# Patient Record
Sex: Female | Born: 1950 | Race: White | Hispanic: No | Marital: Married | Smoking: Former smoker
Health system: Southern US, Community
[De-identification: ages and names within clinical notes are randomized; demographics above are authoritative.]

## PROBLEM LIST (undated history)

## (undated) DIAGNOSIS — E7800 Pure hypercholesterolemia, unspecified: Secondary | ICD-10-CM

## (undated) DIAGNOSIS — M719 Bursopathy, unspecified: Secondary | ICD-10-CM

## (undated) DIAGNOSIS — M2550 Pain in unspecified joint: Secondary | ICD-10-CM

## (undated) DIAGNOSIS — M797 Fibromyalgia: Secondary | ICD-10-CM

## (undated) DIAGNOSIS — T8859XA Other complications of anesthesia, initial encounter: Secondary | ICD-10-CM

## (undated) DIAGNOSIS — K760 Fatty (change of) liver, not elsewhere classified: Secondary | ICD-10-CM

## (undated) DIAGNOSIS — J189 Pneumonia, unspecified organism: Secondary | ICD-10-CM

## (undated) DIAGNOSIS — R0602 Shortness of breath: Secondary | ICD-10-CM

## (undated) DIAGNOSIS — K5900 Constipation, unspecified: Secondary | ICD-10-CM

## (undated) DIAGNOSIS — F419 Anxiety disorder, unspecified: Secondary | ICD-10-CM

## (undated) DIAGNOSIS — R079 Chest pain, unspecified: Secondary | ICD-10-CM

## (undated) DIAGNOSIS — M1990 Unspecified osteoarthritis, unspecified site: Secondary | ICD-10-CM

## (undated) DIAGNOSIS — F32A Depression, unspecified: Secondary | ICD-10-CM

## (undated) DIAGNOSIS — M47814 Spondylosis without myelopathy or radiculopathy, thoracic region: Secondary | ICD-10-CM

## (undated) DIAGNOSIS — Z9289 Personal history of other medical treatment: Secondary | ICD-10-CM

## (undated) DIAGNOSIS — N289 Disorder of kidney and ureter, unspecified: Secondary | ICD-10-CM

## (undated) DIAGNOSIS — E559 Vitamin D deficiency, unspecified: Secondary | ICD-10-CM

## (undated) DIAGNOSIS — M549 Dorsalgia, unspecified: Secondary | ICD-10-CM

## (undated) DIAGNOSIS — K829 Disease of gallbladder, unspecified: Secondary | ICD-10-CM

## (undated) DIAGNOSIS — E039 Hypothyroidism, unspecified: Secondary | ICD-10-CM

## (undated) DIAGNOSIS — F909 Attention-deficit hyperactivity disorder, unspecified type: Secondary | ICD-10-CM

## (undated) DIAGNOSIS — F988 Other specified behavioral and emotional disorders with onset usually occurring in childhood and adolescence: Secondary | ICD-10-CM

## (undated) DIAGNOSIS — Z91018 Allergy to other foods: Secondary | ICD-10-CM

## (undated) DIAGNOSIS — E079 Disorder of thyroid, unspecified: Secondary | ICD-10-CM

## (undated) DIAGNOSIS — D649 Anemia, unspecified: Secondary | ICD-10-CM

## (undated) DIAGNOSIS — M810 Age-related osteoporosis without current pathological fracture: Secondary | ICD-10-CM

## (undated) DIAGNOSIS — R5383 Other fatigue: Secondary | ICD-10-CM

## (undated) DIAGNOSIS — J45909 Unspecified asthma, uncomplicated: Secondary | ICD-10-CM

## (undated) HISTORY — PX: KNEE SURGERY: SHX244

## (undated) HISTORY — DX: Allergy to other foods: Z91.018

## (undated) HISTORY — DX: Dorsalgia, unspecified: M54.9

## (undated) HISTORY — DX: Disorder of kidney and ureter, unspecified: N28.9

## (undated) HISTORY — DX: Vitamin D deficiency, unspecified: E55.9

## (undated) HISTORY — DX: Unspecified osteoarthritis, unspecified site: M19.90

## (undated) HISTORY — PX: EYE SURGERY: SHX253

## (undated) HISTORY — DX: Depression, unspecified: F32.A

## (undated) HISTORY — DX: Pain in unspecified joint: M25.50

## (undated) HISTORY — DX: Fatty (change of) liver, not elsewhere classified: K76.0

## (undated) HISTORY — DX: Chest pain, unspecified: R07.9

## (undated) HISTORY — DX: Other specified behavioral and emotional disorders with onset usually occurring in childhood and adolescence: F98.8

## (undated) HISTORY — DX: Anemia, unspecified: D64.9

## (undated) HISTORY — DX: Unspecified asthma, uncomplicated: J45.909

## (undated) HISTORY — DX: Attention-deficit hyperactivity disorder, unspecified type: F90.9

## (undated) HISTORY — DX: Fibromyalgia: M79.7

## (undated) HISTORY — DX: Bursopathy, unspecified: M71.9

## (undated) HISTORY — DX: Other fatigue: R53.83

## (undated) HISTORY — DX: Disease of gallbladder, unspecified: K82.9

## (undated) HISTORY — DX: Constipation, unspecified: K59.00

## (undated) HISTORY — PX: COLONOSCOPY: SHX174

## (undated) HISTORY — DX: Shortness of breath: R06.02

## (undated) HISTORY — DX: Hypothyroidism, unspecified: E03.9

## (undated) HISTORY — DX: Spondylosis without myelopathy or radiculopathy, thoracic region: M47.814

## (undated) HISTORY — DX: Anxiety disorder, unspecified: F41.9

## (undated) HISTORY — DX: Age-related osteoporosis without current pathological fracture: M81.0

---

## 1998-08-01 ENCOUNTER — Emergency Department (HOSPITAL_COMMUNITY): Admission: EM | Admit: 1998-08-01 | Discharge: 1998-08-01 | Payer: Self-pay | Admitting: Family Medicine

## 1998-08-14 ENCOUNTER — Emergency Department (HOSPITAL_COMMUNITY): Admission: EM | Admit: 1998-08-14 | Discharge: 1998-08-14 | Payer: Self-pay | Admitting: Emergency Medicine

## 2000-07-15 ENCOUNTER — Other Ambulatory Visit: Admission: RE | Admit: 2000-07-15 | Discharge: 2000-07-15 | Payer: Self-pay | Admitting: *Deleted

## 2000-07-15 ENCOUNTER — Ambulatory Visit (HOSPITAL_COMMUNITY): Admission: RE | Admit: 2000-07-15 | Discharge: 2000-07-15 | Payer: Self-pay | Admitting: *Deleted

## 2000-07-15 ENCOUNTER — Encounter: Payer: Self-pay | Admitting: *Deleted

## 2001-07-22 ENCOUNTER — Ambulatory Visit (HOSPITAL_COMMUNITY): Admission: RE | Admit: 2001-07-22 | Discharge: 2001-07-22 | Payer: Self-pay | Admitting: *Deleted

## 2001-07-22 ENCOUNTER — Encounter: Payer: Self-pay | Admitting: *Deleted

## 2001-09-10 ENCOUNTER — Encounter: Payer: Self-pay | Admitting: Emergency Medicine

## 2001-09-10 ENCOUNTER — Emergency Department (HOSPITAL_COMMUNITY): Admission: EM | Admit: 2001-09-10 | Discharge: 2001-09-10 | Payer: Self-pay | Admitting: Emergency Medicine

## 2004-03-01 ENCOUNTER — Ambulatory Visit (HOSPITAL_COMMUNITY): Admission: RE | Admit: 2004-03-01 | Discharge: 2004-03-01 | Payer: Self-pay | Admitting: Internal Medicine

## 2006-04-01 ENCOUNTER — Ambulatory Visit (HOSPITAL_COMMUNITY): Admission: RE | Admit: 2006-04-01 | Discharge: 2006-04-01 | Payer: Self-pay | Admitting: Family Medicine

## 2006-05-07 ENCOUNTER — Other Ambulatory Visit: Admission: RE | Admit: 2006-05-07 | Discharge: 2006-05-07 | Payer: Self-pay | Admitting: Obstetrics and Gynecology

## 2006-07-16 ENCOUNTER — Ambulatory Visit: Payer: Self-pay | Admitting: Internal Medicine

## 2006-07-30 ENCOUNTER — Ambulatory Visit: Payer: Self-pay | Admitting: Internal Medicine

## 2006-07-30 ENCOUNTER — Encounter (INDEPENDENT_AMBULATORY_CARE_PROVIDER_SITE_OTHER): Payer: Self-pay | Admitting: Specialist

## 2006-12-08 ENCOUNTER — Emergency Department (HOSPITAL_COMMUNITY): Admission: EM | Admit: 2006-12-08 | Discharge: 2006-12-08 | Payer: Self-pay | Admitting: Emergency Medicine

## 2009-04-23 ENCOUNTER — Ambulatory Visit (HOSPITAL_COMMUNITY): Admission: RE | Admit: 2009-04-23 | Discharge: 2009-04-23 | Payer: Self-pay | Admitting: Internal Medicine

## 2010-04-26 ENCOUNTER — Other Ambulatory Visit: Admission: RE | Admit: 2010-04-26 | Discharge: 2010-04-26 | Payer: Self-pay | Admitting: Obstetrics and Gynecology

## 2011-09-15 ENCOUNTER — Other Ambulatory Visit (HOSPITAL_COMMUNITY): Payer: Self-pay | Admitting: Internal Medicine

## 2011-09-15 DIAGNOSIS — Z1231 Encounter for screening mammogram for malignant neoplasm of breast: Secondary | ICD-10-CM

## 2011-10-10 ENCOUNTER — Ambulatory Visit (HOSPITAL_COMMUNITY)
Admission: RE | Admit: 2011-10-10 | Discharge: 2011-10-10 | Disposition: A | Discharge status: Home / Self Care | Payer: Managed Care, Other (non HMO) | Source: Ambulatory Visit | Attending: Internal Medicine | Admitting: Internal Medicine

## 2011-10-10 DIAGNOSIS — Z1231 Encounter for screening mammogram for malignant neoplasm of breast: Secondary | ICD-10-CM | POA: Insufficient documentation

## 2013-05-04 ENCOUNTER — Encounter: Payer: Self-pay | Admitting: Internal Medicine

## 2013-10-14 ENCOUNTER — Ambulatory Visit: Payer: Self-pay | Admitting: Podiatrist

## 2013-10-21 ENCOUNTER — Ambulatory Visit (INDEPENDENT_AMBULATORY_CARE_PROVIDER_SITE_OTHER): Payer: Commercial Managed Care - HMO | Admitting: Podiatrist

## 2013-10-21 ENCOUNTER — Encounter: Payer: Self-pay | Admitting: Podiatrist

## 2013-10-21 ENCOUNTER — Ambulatory Visit (INDEPENDENT_AMBULATORY_CARE_PROVIDER_SITE_OTHER): Payer: Commercial Managed Care - HMO

## 2013-10-21 DIAGNOSIS — R52 Pain, unspecified: Secondary | ICD-10-CM

## 2013-10-21 DIAGNOSIS — M7660 Achilles tendinitis, unspecified leg: Secondary | ICD-10-CM

## 2013-10-21 DIAGNOSIS — M722 Plantar fascial fibromatosis: Secondary | ICD-10-CM

## 2013-10-21 MED ORDER — DICLOFENAC SODIUM 1 % TD GEL: 2.0000 g | Freq: Four times a day (QID) | TRANSDERMAL | Status: DC

## 2013-10-21 MED ORDER — TRIAMCINOLONE ACETONIDE 40 MG/ML IJ SUSP
20.0000 mg | Freq: Once | INTRAMUSCULAR | Status: AC
  Administered 2013-10-21: 20 mg

## 2013-10-21 NOTE — Patient Instructions (Signed)
Achilles Tendinitis Achilles tendinitis is inflammation of the tough, cord-like band that attaches the lower muscles of your leg to your heel (Achilles tendon). It is usually caused by overusing the tendon and joint involved.  CAUSES Achilles tendinitis can happen because of:  A sudden increase in exercise or activity (such as running).  Doing the same exercises or activities (such as jumping) over and over.  Not warming up calf muscles before exercising.  Exercising in shoes that are worn out or not made for exercise.  Having arthritis or a bone growth on the back of the heel bone. This can rub against the tendon and hurt the tendon. SIGNS AND SYMPTOMS The most common symptoms are:  Pain in the back of the leg, just above the heel. The pain usually gets worse with exercise and better with rest.  Stiffness or soreness in the back of the leg, especially in the morning.  Swelling of the skin over the Achilles tendon.  Trouble standing on tiptoe. Sometimes, an Achilles tendon tears (ruptures). Symptoms of an Achilles tendon rupture can include:  Sudden, severe pain in the back of the leg.  Trouble putting weight on the foot or walking normally. DIAGNOSIS Achilles tendinitis will be diagnosed based on symptoms and a physical examination. An X-ray may be done to check if another condition is causing your symptoms.  TREATMENT  Achilles tendinitis usually gets better over time. It can take weeks to months to heal completely. Treatment focuses on treating the symptoms and helping the injury heal. HOME CARE INSTRUCTIONS   Rest your Achilles tendon and avoid activities that cause pain.  Apply ice to the injured area:  Put ice in a plastic bag.  Place a towel between your skin and the bag.  Leave the ice on for 20 minutes, 2 3 times a day  Try to avoid using the tendon (other than gentle range of motion) while the tendon is painful. Do not resume use until instructed by your health  care provider. Then begin use gradually. Do not increase use to the point of pain. If pain does develop, decrease use and continue the above measures. Gradually increase activities that do not cause discomfort until you achieve normal use.  Do exercises to make your calf muscles stronger and more flexible. Your health care provider or physical therapist can recommend exercises for you to do.  Wrap your ankle with an elastic bandage or other wrap. This can help keep your tendon from moving too much. Your health care provider will show you how to wrap your ankle correctly.  Only take over-the-counter or prescription medicines for pain, discomfort, or fever as directed by your health care provider. SEEK MEDICAL CARE IF:   Your pain and swelling increase or pain is uncontrolled with medicines.  You develop new, unexplained symptoms or your symptoms get worse.  You are unable to move your toes or foot.  You develop warmth and swelling in your foot.  You have an unexplained temperature. MAKE SURE YOU:   Understand these instructions.  Will watch your condition.  Will get help right away if you are not doing well or get worse. Document Released: 05/28/2005 Document Revised: 06/08/2013 Document Reviewed: 03/30/2013 [facility] Patient Information 2014 [place].

## 2013-10-21 NOTE — Progress Notes (Signed)
   Subjective:    Patient ID: Amy Blackwell, female    DOB: 01/24/1951, 63 y.o.   MRN: [id number]  HPI Patient presents today for pain on the posterior aspect of the right foot. She states it's been hurting for over a year and she tried meloxicam but it did not help the posterior heel pain on the right foot. She also states she's been having pain in the left foot and mid arch region dorsally. She states that the meloxicam has been helping this region. She relates that her husband squeezed along her right heel she felt pain at that time..   Review of Systems  Constitutional: Positive for fatigue and unexpected weight change.  HENT: Positive for sore throat.   Respiratory: Positive for wheezing.   Musculoskeletal: Positive for back pain, gait problem, joint swelling and myalgias.  Allergic/Immunologic: Positive for food allergies.       Objective:   Physical Exam Vascular exam reveals palpable pedal pulses at 2/4 DP and PT bilateral. Neurological sensation is intact epicriticaly and protectively. Negative Tinel sign is elicited. Dermatological examination is within normal limits. Musculoskeletal examination reveals pain on palpation plantar medial aspect of the right heel at the insertion of the plantar fascial the medial calcaneal tubercle with pressure. Diffuse pain is also present on the posterior aspect of the Achilles tendon at its insertion. Plantar fascial pain is much worse with palpation in the posterior heel pain. Generalized discomfort on the mid arch region of the left foot is also noted. X-rays are negative for fracture although she states she saw another physician who stated that she had 2 fractures in both of her feet at the same time.     Assessment & Plan:  Plantar fasciitis right, Achilles tendinitis right, midfoot arthritis left  Plan: Injected the plantar fashion with Kenalog and Marcaine mixture today without complication. Recommended Voltaren gel and prescription was  sent to her pharmacy. An ankle brace was dispensed for her right ankle. Heel lifts were fabricated and she is to use these in her tennis shoes. Currently she wears Finn comfort shoes with an open back heel and small elevated heel. I will see her back in 3 weeks for recheck. If any problems or concerns arise prior that visit she will call.

## 2014-02-09 ENCOUNTER — Encounter (HOSPITAL_BASED_OUTPATIENT_CLINIC_OR_DEPARTMENT_OTHER): Payer: Self-pay | Admitting: Emergency Medicine

## 2014-02-09 ENCOUNTER — Emergency Department (HOSPITAL_BASED_OUTPATIENT_CLINIC_OR_DEPARTMENT_OTHER)
Admission: EM | Admit: 2014-02-09 | Discharge: 2014-02-09 | Disposition: A | Discharge status: Home / Self Care | Payer: Medicare HMO | Attending: Emergency Medicine | Admitting: Emergency Medicine

## 2014-02-09 ENCOUNTER — Emergency Department (HOSPITAL_BASED_OUTPATIENT_CLINIC_OR_DEPARTMENT_OTHER): Payer: Medicare HMO

## 2014-02-09 DIAGNOSIS — Y9289 Other specified places as the place of occurrence of the external cause: Secondary | ICD-10-CM | POA: Insufficient documentation

## 2014-02-09 DIAGNOSIS — S4980XA Other specified injuries of shoulder and upper arm, unspecified arm, initial encounter: Secondary | ICD-10-CM | POA: Insufficient documentation

## 2014-02-09 DIAGNOSIS — E079 Disorder of thyroid, unspecified: Secondary | ICD-10-CM | POA: Insufficient documentation

## 2014-02-09 DIAGNOSIS — Z79899 Other long term (current) drug therapy: Secondary | ICD-10-CM | POA: Insufficient documentation

## 2014-02-09 DIAGNOSIS — W010XXA Fall on same level from slipping, tripping and stumbling without subsequent striking against object, initial encounter: Secondary | ICD-10-CM | POA: Insufficient documentation

## 2014-02-09 DIAGNOSIS — S46909A Unspecified injury of unspecified muscle, fascia and tendon at shoulder and upper arm level, unspecified arm, initial encounter: Secondary | ICD-10-CM | POA: Insufficient documentation

## 2014-02-09 DIAGNOSIS — S52121A Displaced fracture of head of right radius, initial encounter for closed fracture: Secondary | ICD-10-CM

## 2014-02-09 DIAGNOSIS — E7800 Pure hypercholesterolemia, unspecified: Secondary | ICD-10-CM | POA: Insufficient documentation

## 2014-02-09 DIAGNOSIS — M129 Arthropathy, unspecified: Secondary | ICD-10-CM | POA: Insufficient documentation

## 2014-02-09 DIAGNOSIS — Z791 Long term (current) use of non-steroidal anti-inflammatories (NSAID): Secondary | ICD-10-CM | POA: Insufficient documentation

## 2014-02-09 DIAGNOSIS — IMO0002 Reserved for concepts with insufficient information to code with codable children: Secondary | ICD-10-CM | POA: Insufficient documentation

## 2014-02-09 DIAGNOSIS — M810 Age-related osteoporosis without current pathological fracture: Secondary | ICD-10-CM | POA: Insufficient documentation

## 2014-02-09 DIAGNOSIS — E669 Obesity, unspecified: Secondary | ICD-10-CM | POA: Insufficient documentation

## 2014-02-09 DIAGNOSIS — S52123A Displaced fracture of head of unspecified radius, initial encounter for closed fracture: Secondary | ICD-10-CM | POA: Insufficient documentation

## 2014-02-09 DIAGNOSIS — Y9389 Activity, other specified: Secondary | ICD-10-CM | POA: Insufficient documentation

## 2014-02-09 HISTORY — DX: Unspecified osteoarthritis, unspecified site: M19.90

## 2014-02-09 HISTORY — DX: Pure hypercholesterolemia, unspecified: E78.00

## 2014-02-09 HISTORY — DX: Disorder of thyroid, unspecified: E07.9

## 2014-02-09 MED ORDER — OXYCODONE-ACETAMINOPHEN 5-325 MG PO TABS: 2.0000 | ORAL_TABLET | ORAL | Status: DC | PRN

## 2014-02-09 MED ORDER — IBUPROFEN 800 MG PO TABS
800.0000 mg | ORAL_TABLET | Freq: Once | ORAL | Status: AC
  Administered 2014-02-09: 800 mg via ORAL
  Filled 2014-02-09: qty 1

## 2014-02-09 NOTE — Discharge Instructions (Signed)
Please call Dr. Trevor Mace office today and tomorrow to make follow up appointment  Elbow Fracture, Simple A fracture is a break in one of the bones.When fractures are not displaced or separated, they may be treated with only a sling or splint. The sling or splint may only be required for two to three weeks. In these cases, often the elbow is put through early range of motion exercises to prevent the elbow from getting stiff. DIAGNOSIS  The diagnosis (learning what is wrong) of a fractured elbow is made by x-Hibba Schram. These may be required before and after the elbow is put into a splint or cast. X-rays are taken after to make sure the bone pieces have not moved. HOME CARE INSTRUCTIONS   Only take over-the-counter or prescription medicines for pain, discomfort, or fever as directed by your caregiver.  If you have a splint held on with an elastic wrap and your hand or fingers become numb or cold and blue, loosen the wrap and reapply more loosely. See your caregiver if there is no relief.  You may use ice for twenty minutes, four times per day, for the first two to three days.  Use your elbow as directed.  See your caregiver as directed. It is very important to keep all follow-up referrals and appointments in order to avoid any long-term problems with your elbow including chronic pain or stiffness. SEEK IMMEDIATE MEDICAL CARE IF:   There is swelling or increasing pain in elbow.  You begin to lose feeling or experience numbness or tingling in your hand or fingers.  You develop swelling of the hand and fingers.  You get a cold or blue hand or fingers on affected side. MAKE SURE YOU:   Understand these instructions.  Will watch your condition.  Will get help right away if you are not doing well or get worse. Document Released: 08/12/2001 Document Revised: 11/10/2011 Document Reviewed: 07/03/2009 [facility] Patient Information 2014 [place], [place].

## 2014-02-09 NOTE — ED Notes (Signed)
MD at bedside. 

## 2014-02-09 NOTE — ED Provider Notes (Signed)
CSN: [id number]     Arrival date & time 02/09/14  1500 History   First MD Initiated Contact with Patient 02/09/14 1513     Chief Complaint  Patient presents with  . Fall     (Consider location/radiation/quality/duration/timing/severity/associated sxs/prior Treatment) HPI 63 y.o. Female tripped and fell on uneven concrete just prior to arrival.  States fell face first and complains of pain right elbow and possible abrasion left knee.  Patient ambulatory from site and drove here.  She denies head injury, neck pain, or hip pain.  She has not take any meds or other therapy.  Past Medical History  Diagnosis Date  . Osteoporosis   . Bursitis   . Arthritis   . Thyroid disease   . Hypercholesterolemia    History reviewed. No pertinent past surgical history. No family history on file. History  Substance Use Topics  . Smoking status: Never Smoker   . Smokeless tobacco: Never Used  . Alcohol Use: No   OB History   Grav Para Term Preterm Abortions TAB SAB Ect Mult Living                 Review of Systems  All other systems reviewed and are negative.     Allergies  Codeine and Shellfish allergy  Home Medications   Prior to Admission medications   Medication Sig Start Date End Date Taking? Authorizing Provider  diclofenac sodium (VOLTAREN) 1 % GEL Apply 2 g topically 4 (four) times daily. Rub into affected area of foot 2 to 4 times daily 10/21/13   Trudie Buckler, DPM  Fluticasone-Salmeterol (ADVAIR DISKUS) 500-50 MCG/DOSE AEPB Inhale 1 puff into the lungs 2 (two) times daily.    Historical Provider, MD  levothyroxine (SYNTHROID, LEVOTHROID) 50 MCG tablet  10/11/13   Historical Provider, MD  meloxicam (MOBIC) 15 MG tablet Take 15 mg by mouth daily.    Historical Provider, MD  omeprazole (PRILOSEC) 40 MG capsule  09/30/13   Historical Provider, MD  pravastatin (PRAVACHOL) 10 MG tablet Take 40 mg by mouth daily.     Historical Provider, MD  PROAIR HFA 108 (90 BASE) MCG/ACT inhaler   10/11/13   Historical Provider, MD   BP 134/57  Pulse 86  Temp(Src) 98.1 F (36.7 C) (Oral)  Resp 18  Ht 5' 2"  (1.575 m)  Wt 250 lb (113.399 kg)  BMI 45.71 kg/m2  SpO2 97% Physical Exam  Nursing note and vitals reviewed. Constitutional:  obese  HENT:  Head: Normocephalic.  Right Ear: External ear normal.  Left Ear: External ear normal.  Nose: Nose normal.  Mouth/Throat: Oropharynx is clear and moist.  Eyes: Conjunctivae and EOM are normal. Pupils are equal, round, and reactive to light.  Neck: Normal range of motion. Neck supple.  No ttp  Cardiovascular: Normal rate and regular rhythm.   Pulmonary/Chest: Effort normal.  Abdominal: Soft.  Musculoskeletal:       Right shoulder: She exhibits tenderness.       Right elbow: She exhibits decreased range of motion. She exhibits no swelling, no effusion, no deformity and no laceration. Tenderness found. Radial head tenderness noted. No medial epicondyle, no lateral epicondyle and no olecranon process tenderness noted.    ED Course  Procedures (including critical care time) Labs Review Labs Reviewed - No data to display  Imaging Review Dg Shoulder Right  02/09/2014   CLINICAL DATA:  [place] and injured right shoulder earlier today, now with severe pain.  EXAM: RIGHT SHOULDER - 2+ VIEW  COMPARISON:  None.  FINDINGS: No evidence of acute fracture or glenohumeral dislocation. Subacromial space well preserved. Mild degenerative changes involving the acromioclavicular joint. Well preserved bone mineral density.  IMPRESSION: 1. No acute osseous abnormality. 2. Mild degenerative changes involving the AC joint.   Electronically Signed   By: Evangeline Dakin M.D.   On: 02/09/2014 15:46   Dg Elbow Complete Right  02/09/2014   CLINICAL DATA:  [place].  Right elbow pain.  EXAM: RIGHT ELBOW - COMPLETE 3+ VIEW  COMPARISON:  None.  FINDINGS: There is a minimally displaced intra-articular fracture of the radial head. This involves approximately 40% of the  articular surface of the radial head. No other fractures are identified. There is an associated elbow joint effusion.  IMPRESSION: Intra-articular fracture of the radial head.   Electronically Signed   By: Kalman Jewels M.D.   On: 02/09/2014 15:46   History/physical exam/procedure(s) were performed by non-physician practitioner and as supervising physician I was immediately available for consultation/collaboration. I have reviewed all notes and am in agreement with care and plan.   EKG Interpretation None      MDM   Final diagnoses:  Fracture of radial head, right, closed    Patient with Mason type 2 radial head fracture.  Patient placed in sugar tong splint with sling and referred to Dr. Ninfa Linden.  Discussed need for follow up and return precautions and patient voices understanding.     Shaune Pollack, MD 02/09/14 [phone]

## 2014-02-09 NOTE — ED Notes (Signed)
Pt tripped and fell apprx. 1.5 hours ago. She is c/o right elbow pain and left knee pain. She sts she felt popping in her right elbow.

## 2014-11-20 DIAGNOSIS — M62838 Other muscle spasm: Secondary | ICD-10-CM | POA: Diagnosis not present

## 2014-11-20 DIAGNOSIS — M545 Low back pain: Secondary | ICD-10-CM | POA: Diagnosis not present

## 2014-11-20 DIAGNOSIS — N3001 Acute cystitis with hematuria: Secondary | ICD-10-CM | POA: Diagnosis not present

## 2014-11-20 DIAGNOSIS — R350 Frequency of micturition: Secondary | ICD-10-CM | POA: Diagnosis not present

## 2015-01-25 DIAGNOSIS — M25551 Pain in right hip: Secondary | ICD-10-CM | POA: Diagnosis not present

## 2015-01-25 DIAGNOSIS — M545 Low back pain: Secondary | ICD-10-CM | POA: Diagnosis not present

## 2015-01-25 DIAGNOSIS — M791 Myalgia: Secondary | ICD-10-CM | POA: Diagnosis not present

## 2015-01-25 DIAGNOSIS — M25562 Pain in left knee: Secondary | ICD-10-CM | POA: Diagnosis not present

## 2015-01-25 DIAGNOSIS — M5136 Other intervertebral disc degeneration, lumbar region: Secondary | ICD-10-CM | POA: Diagnosis not present

## 2015-01-25 DIAGNOSIS — M5127 Other intervertebral disc displacement, lumbosacral region: Secondary | ICD-10-CM | POA: Diagnosis not present

## 2015-01-25 DIAGNOSIS — M542 Cervicalgia: Secondary | ICD-10-CM | POA: Diagnosis not present

## 2015-01-25 DIAGNOSIS — M5137 Other intervertebral disc degeneration, lumbosacral region: Secondary | ICD-10-CM | POA: Diagnosis not present

## 2015-01-25 DIAGNOSIS — M461 Sacroiliitis, not elsewhere classified: Secondary | ICD-10-CM | POA: Diagnosis not present

## 2015-01-31 DIAGNOSIS — M542 Cervicalgia: Secondary | ICD-10-CM | POA: Diagnosis not present

## 2015-01-31 DIAGNOSIS — M545 Low back pain: Secondary | ICD-10-CM | POA: Diagnosis not present

## 2015-01-31 DIAGNOSIS — M461 Sacroiliitis, not elsewhere classified: Secondary | ICD-10-CM | POA: Diagnosis not present

## 2015-01-31 DIAGNOSIS — M5136 Other intervertebral disc degeneration, lumbar region: Secondary | ICD-10-CM | POA: Diagnosis not present

## 2015-01-31 DIAGNOSIS — M5137 Other intervertebral disc degeneration, lumbosacral region: Secondary | ICD-10-CM | POA: Diagnosis not present

## 2015-01-31 DIAGNOSIS — M791 Myalgia: Secondary | ICD-10-CM | POA: Diagnosis not present

## 2015-01-31 DIAGNOSIS — M25562 Pain in left knee: Secondary | ICD-10-CM | POA: Diagnosis not present

## 2015-01-31 DIAGNOSIS — M5127 Other intervertebral disc displacement, lumbosacral region: Secondary | ICD-10-CM | POA: Diagnosis not present

## 2015-02-01 DIAGNOSIS — M5136 Other intervertebral disc degeneration, lumbar region: Secondary | ICD-10-CM | POA: Diagnosis not present

## 2015-02-01 DIAGNOSIS — M25562 Pain in left knee: Secondary | ICD-10-CM | POA: Diagnosis not present

## 2015-02-01 DIAGNOSIS — M542 Cervicalgia: Secondary | ICD-10-CM | POA: Diagnosis not present

## 2015-02-01 DIAGNOSIS — M5137 Other intervertebral disc degeneration, lumbosacral region: Secondary | ICD-10-CM | POA: Diagnosis not present

## 2015-02-01 DIAGNOSIS — M5127 Other intervertebral disc displacement, lumbosacral region: Secondary | ICD-10-CM | POA: Diagnosis not present

## 2015-02-01 DIAGNOSIS — M545 Low back pain: Secondary | ICD-10-CM | POA: Diagnosis not present

## 2015-02-01 DIAGNOSIS — M461 Sacroiliitis, not elsewhere classified: Secondary | ICD-10-CM | POA: Diagnosis not present

## 2015-02-01 DIAGNOSIS — M791 Myalgia: Secondary | ICD-10-CM | POA: Diagnosis not present

## 2015-02-05 DIAGNOSIS — M545 Low back pain: Secondary | ICD-10-CM | POA: Diagnosis not present

## 2015-02-05 DIAGNOSIS — M461 Sacroiliitis, not elsewhere classified: Secondary | ICD-10-CM | POA: Diagnosis not present

## 2015-02-05 DIAGNOSIS — M791 Myalgia: Secondary | ICD-10-CM | POA: Diagnosis not present

## 2015-02-05 DIAGNOSIS — M542 Cervicalgia: Secondary | ICD-10-CM | POA: Diagnosis not present

## 2015-02-05 DIAGNOSIS — M5127 Other intervertebral disc displacement, lumbosacral region: Secondary | ICD-10-CM | POA: Diagnosis not present

## 2015-02-05 DIAGNOSIS — M25562 Pain in left knee: Secondary | ICD-10-CM | POA: Diagnosis not present

## 2015-02-05 DIAGNOSIS — M5137 Other intervertebral disc degeneration, lumbosacral region: Secondary | ICD-10-CM | POA: Diagnosis not present

## 2015-02-05 DIAGNOSIS — M5136 Other intervertebral disc degeneration, lumbar region: Secondary | ICD-10-CM | POA: Diagnosis not present

## 2015-02-07 DIAGNOSIS — M5136 Other intervertebral disc degeneration, lumbar region: Secondary | ICD-10-CM | POA: Diagnosis not present

## 2015-02-07 DIAGNOSIS — M545 Low back pain: Secondary | ICD-10-CM | POA: Diagnosis not present

## 2015-02-07 DIAGNOSIS — M5127 Other intervertebral disc displacement, lumbosacral region: Secondary | ICD-10-CM | POA: Diagnosis not present

## 2015-02-07 DIAGNOSIS — M542 Cervicalgia: Secondary | ICD-10-CM | POA: Diagnosis not present

## 2015-02-07 DIAGNOSIS — M25562 Pain in left knee: Secondary | ICD-10-CM | POA: Diagnosis not present

## 2015-02-07 DIAGNOSIS — M461 Sacroiliitis, not elsewhere classified: Secondary | ICD-10-CM | POA: Diagnosis not present

## 2015-02-07 DIAGNOSIS — M5137 Other intervertebral disc degeneration, lumbosacral region: Secondary | ICD-10-CM | POA: Diagnosis not present

## 2015-02-07 DIAGNOSIS — M791 Myalgia: Secondary | ICD-10-CM | POA: Diagnosis not present

## 2015-02-09 DIAGNOSIS — M5127 Other intervertebral disc displacement, lumbosacral region: Secondary | ICD-10-CM | POA: Diagnosis not present

## 2015-02-09 DIAGNOSIS — M545 Low back pain: Secondary | ICD-10-CM | POA: Diagnosis not present

## 2015-02-09 DIAGNOSIS — M791 Myalgia: Secondary | ICD-10-CM | POA: Diagnosis not present

## 2015-02-09 DIAGNOSIS — M542 Cervicalgia: Secondary | ICD-10-CM | POA: Diagnosis not present

## 2015-02-09 DIAGNOSIS — M25562 Pain in left knee: Secondary | ICD-10-CM | POA: Diagnosis not present

## 2015-02-09 DIAGNOSIS — M5136 Other intervertebral disc degeneration, lumbar region: Secondary | ICD-10-CM | POA: Diagnosis not present

## 2015-02-09 DIAGNOSIS — M461 Sacroiliitis, not elsewhere classified: Secondary | ICD-10-CM | POA: Diagnosis not present

## 2015-02-09 DIAGNOSIS — M5137 Other intervertebral disc degeneration, lumbosacral region: Secondary | ICD-10-CM | POA: Diagnosis not present

## 2015-02-12 DIAGNOSIS — M461 Sacroiliitis, not elsewhere classified: Secondary | ICD-10-CM | POA: Diagnosis not present

## 2015-02-12 DIAGNOSIS — M5137 Other intervertebral disc degeneration, lumbosacral region: Secondary | ICD-10-CM | POA: Diagnosis not present

## 2015-02-12 DIAGNOSIS — M25562 Pain in left knee: Secondary | ICD-10-CM | POA: Diagnosis not present

## 2015-02-12 DIAGNOSIS — M5136 Other intervertebral disc degeneration, lumbar region: Secondary | ICD-10-CM | POA: Diagnosis not present

## 2015-02-12 DIAGNOSIS — M545 Low back pain: Secondary | ICD-10-CM | POA: Diagnosis not present

## 2015-02-12 DIAGNOSIS — M542 Cervicalgia: Secondary | ICD-10-CM | POA: Diagnosis not present

## 2015-02-12 DIAGNOSIS — M791 Myalgia: Secondary | ICD-10-CM | POA: Diagnosis not present

## 2015-02-12 DIAGNOSIS — M5127 Other intervertebral disc displacement, lumbosacral region: Secondary | ICD-10-CM | POA: Diagnosis not present

## 2015-02-14 DIAGNOSIS — M5136 Other intervertebral disc degeneration, lumbar region: Secondary | ICD-10-CM | POA: Diagnosis not present

## 2015-02-14 DIAGNOSIS — M791 Myalgia: Secondary | ICD-10-CM | POA: Diagnosis not present

## 2015-02-14 DIAGNOSIS — M542 Cervicalgia: Secondary | ICD-10-CM | POA: Diagnosis not present

## 2015-02-14 DIAGNOSIS — M25562 Pain in left knee: Secondary | ICD-10-CM | POA: Diagnosis not present

## 2015-02-14 DIAGNOSIS — M545 Low back pain: Secondary | ICD-10-CM | POA: Diagnosis not present

## 2015-02-14 DIAGNOSIS — M5127 Other intervertebral disc displacement, lumbosacral region: Secondary | ICD-10-CM | POA: Diagnosis not present

## 2015-02-14 DIAGNOSIS — M5137 Other intervertebral disc degeneration, lumbosacral region: Secondary | ICD-10-CM | POA: Diagnosis not present

## 2015-02-14 DIAGNOSIS — M461 Sacroiliitis, not elsewhere classified: Secondary | ICD-10-CM | POA: Diagnosis not present

## 2015-02-19 DIAGNOSIS — M542 Cervicalgia: Secondary | ICD-10-CM | POA: Diagnosis not present

## 2015-02-19 DIAGNOSIS — M5137 Other intervertebral disc degeneration, lumbosacral region: Secondary | ICD-10-CM | POA: Diagnosis not present

## 2015-02-19 DIAGNOSIS — M5127 Other intervertebral disc displacement, lumbosacral region: Secondary | ICD-10-CM | POA: Diagnosis not present

## 2015-02-19 DIAGNOSIS — M25562 Pain in left knee: Secondary | ICD-10-CM | POA: Diagnosis not present

## 2015-02-19 DIAGNOSIS — M791 Myalgia: Secondary | ICD-10-CM | POA: Diagnosis not present

## 2015-02-19 DIAGNOSIS — M461 Sacroiliitis, not elsewhere classified: Secondary | ICD-10-CM | POA: Diagnosis not present

## 2015-02-19 DIAGNOSIS — M545 Low back pain: Secondary | ICD-10-CM | POA: Diagnosis not present

## 2015-02-19 DIAGNOSIS — M5136 Other intervertebral disc degeneration, lumbar region: Secondary | ICD-10-CM | POA: Diagnosis not present

## 2015-02-21 DIAGNOSIS — M5127 Other intervertebral disc displacement, lumbosacral region: Secondary | ICD-10-CM | POA: Diagnosis not present

## 2015-02-21 DIAGNOSIS — M461 Sacroiliitis, not elsewhere classified: Secondary | ICD-10-CM | POA: Diagnosis not present

## 2015-02-21 DIAGNOSIS — M25562 Pain in left knee: Secondary | ICD-10-CM | POA: Diagnosis not present

## 2015-02-21 DIAGNOSIS — M5137 Other intervertebral disc degeneration, lumbosacral region: Secondary | ICD-10-CM | POA: Diagnosis not present

## 2015-02-21 DIAGNOSIS — M1712 Unilateral primary osteoarthritis, left knee: Secondary | ICD-10-CM | POA: Diagnosis not present

## 2015-02-21 DIAGNOSIS — M545 Low back pain: Secondary | ICD-10-CM | POA: Diagnosis not present

## 2015-02-21 DIAGNOSIS — M542 Cervicalgia: Secondary | ICD-10-CM | POA: Diagnosis not present

## 2015-02-21 DIAGNOSIS — M791 Myalgia: Secondary | ICD-10-CM | POA: Diagnosis not present

## 2015-02-21 DIAGNOSIS — M5136 Other intervertebral disc degeneration, lumbar region: Secondary | ICD-10-CM | POA: Diagnosis not present

## 2015-02-26 DIAGNOSIS — M545 Low back pain: Secondary | ICD-10-CM | POA: Diagnosis not present

## 2015-02-26 DIAGNOSIS — M461 Sacroiliitis, not elsewhere classified: Secondary | ICD-10-CM | POA: Diagnosis not present

## 2015-02-26 DIAGNOSIS — M5136 Other intervertebral disc degeneration, lumbar region: Secondary | ICD-10-CM | POA: Diagnosis not present

## 2015-02-26 DIAGNOSIS — M5127 Other intervertebral disc displacement, lumbosacral region: Secondary | ICD-10-CM | POA: Diagnosis not present

## 2015-02-26 DIAGNOSIS — M791 Myalgia: Secondary | ICD-10-CM | POA: Diagnosis not present

## 2015-02-26 DIAGNOSIS — M25562 Pain in left knee: Secondary | ICD-10-CM | POA: Diagnosis not present

## 2015-02-26 DIAGNOSIS — M542 Cervicalgia: Secondary | ICD-10-CM | POA: Diagnosis not present

## 2015-02-26 DIAGNOSIS — M5137 Other intervertebral disc degeneration, lumbosacral region: Secondary | ICD-10-CM | POA: Diagnosis not present

## 2015-02-28 DIAGNOSIS — M791 Myalgia: Secondary | ICD-10-CM | POA: Diagnosis not present

## 2015-02-28 DIAGNOSIS — M25562 Pain in left knee: Secondary | ICD-10-CM | POA: Diagnosis not present

## 2015-02-28 DIAGNOSIS — M5137 Other intervertebral disc degeneration, lumbosacral region: Secondary | ICD-10-CM | POA: Diagnosis not present

## 2015-02-28 DIAGNOSIS — M1712 Unilateral primary osteoarthritis, left knee: Secondary | ICD-10-CM | POA: Diagnosis not present

## 2015-02-28 DIAGNOSIS — M461 Sacroiliitis, not elsewhere classified: Secondary | ICD-10-CM | POA: Diagnosis not present

## 2015-02-28 DIAGNOSIS — M5127 Other intervertebral disc displacement, lumbosacral region: Secondary | ICD-10-CM | POA: Diagnosis not present

## 2015-02-28 DIAGNOSIS — M542 Cervicalgia: Secondary | ICD-10-CM | POA: Diagnosis not present

## 2015-02-28 DIAGNOSIS — M5136 Other intervertebral disc degeneration, lumbar region: Secondary | ICD-10-CM | POA: Diagnosis not present

## 2015-02-28 DIAGNOSIS — M545 Low back pain: Secondary | ICD-10-CM | POA: Diagnosis not present

## 2015-03-09 DIAGNOSIS — M5127 Other intervertebral disc displacement, lumbosacral region: Secondary | ICD-10-CM | POA: Diagnosis not present

## 2015-03-09 DIAGNOSIS — M542 Cervicalgia: Secondary | ICD-10-CM | POA: Diagnosis not present

## 2015-03-09 DIAGNOSIS — M5136 Other intervertebral disc degeneration, lumbar region: Secondary | ICD-10-CM | POA: Diagnosis not present

## 2015-03-09 DIAGNOSIS — M461 Sacroiliitis, not elsewhere classified: Secondary | ICD-10-CM | POA: Diagnosis not present

## 2015-03-09 DIAGNOSIS — M545 Low back pain: Secondary | ICD-10-CM | POA: Diagnosis not present

## 2015-03-09 DIAGNOSIS — M25562 Pain in left knee: Secondary | ICD-10-CM | POA: Diagnosis not present

## 2015-03-09 DIAGNOSIS — M5137 Other intervertebral disc degeneration, lumbosacral region: Secondary | ICD-10-CM | POA: Diagnosis not present

## 2015-03-09 DIAGNOSIS — M791 Myalgia: Secondary | ICD-10-CM | POA: Diagnosis not present

## 2015-03-12 DIAGNOSIS — M791 Myalgia: Secondary | ICD-10-CM | POA: Diagnosis not present

## 2015-03-12 DIAGNOSIS — M461 Sacroiliitis, not elsewhere classified: Secondary | ICD-10-CM | POA: Diagnosis not present

## 2015-03-12 DIAGNOSIS — M179 Osteoarthritis of knee, unspecified: Secondary | ICD-10-CM | POA: Diagnosis not present

## 2015-03-12 DIAGNOSIS — M1711 Unilateral primary osteoarthritis, right knee: Secondary | ICD-10-CM | POA: Diagnosis not present

## 2015-03-12 DIAGNOSIS — M25561 Pain in right knee: Secondary | ICD-10-CM | POA: Diagnosis not present

## 2015-03-12 DIAGNOSIS — M5413 Radiculopathy, cervicothoracic region: Secondary | ICD-10-CM | POA: Diagnosis not present

## 2015-03-12 DIAGNOSIS — M542 Cervicalgia: Secondary | ICD-10-CM | POA: Diagnosis not present

## 2015-03-12 DIAGNOSIS — M25562 Pain in left knee: Secondary | ICD-10-CM | POA: Diagnosis not present

## 2015-03-12 DIAGNOSIS — M545 Low back pain: Secondary | ICD-10-CM | POA: Diagnosis not present

## 2015-03-14 DIAGNOSIS — M461 Sacroiliitis, not elsewhere classified: Secondary | ICD-10-CM | POA: Diagnosis not present

## 2015-03-14 DIAGNOSIS — M791 Myalgia: Secondary | ICD-10-CM | POA: Diagnosis not present

## 2015-03-14 DIAGNOSIS — M25562 Pain in left knee: Secondary | ICD-10-CM | POA: Diagnosis not present

## 2015-03-14 DIAGNOSIS — M545 Low back pain: Secondary | ICD-10-CM | POA: Diagnosis not present

## 2015-03-14 DIAGNOSIS — M5413 Radiculopathy, cervicothoracic region: Secondary | ICD-10-CM | POA: Diagnosis not present

## 2015-03-14 DIAGNOSIS — M542 Cervicalgia: Secondary | ICD-10-CM | POA: Diagnosis not present

## 2015-03-14 DIAGNOSIS — M179 Osteoarthritis of knee, unspecified: Secondary | ICD-10-CM | POA: Diagnosis not present

## 2015-03-14 DIAGNOSIS — M25561 Pain in right knee: Secondary | ICD-10-CM | POA: Diagnosis not present

## 2015-03-14 DIAGNOSIS — M1712 Unilateral primary osteoarthritis, left knee: Secondary | ICD-10-CM | POA: Diagnosis not present

## 2015-03-19 DIAGNOSIS — M25561 Pain in right knee: Secondary | ICD-10-CM | POA: Diagnosis not present

## 2015-03-19 DIAGNOSIS — M461 Sacroiliitis, not elsewhere classified: Secondary | ICD-10-CM | POA: Diagnosis not present

## 2015-03-19 DIAGNOSIS — M25562 Pain in left knee: Secondary | ICD-10-CM | POA: Diagnosis not present

## 2015-03-19 DIAGNOSIS — M545 Low back pain: Secondary | ICD-10-CM | POA: Diagnosis not present

## 2015-03-19 DIAGNOSIS — M179 Osteoarthritis of knee, unspecified: Secondary | ICD-10-CM | POA: Diagnosis not present

## 2015-03-19 DIAGNOSIS — M1711 Unilateral primary osteoarthritis, right knee: Secondary | ICD-10-CM | POA: Diagnosis not present

## 2015-03-19 DIAGNOSIS — M5413 Radiculopathy, cervicothoracic region: Secondary | ICD-10-CM | POA: Diagnosis not present

## 2015-03-19 DIAGNOSIS — M791 Myalgia: Secondary | ICD-10-CM | POA: Diagnosis not present

## 2015-03-19 DIAGNOSIS — M542 Cervicalgia: Secondary | ICD-10-CM | POA: Diagnosis not present

## 2015-03-21 DIAGNOSIS — M1712 Unilateral primary osteoarthritis, left knee: Secondary | ICD-10-CM | POA: Diagnosis not present

## 2015-03-21 DIAGNOSIS — M545 Low back pain: Secondary | ICD-10-CM | POA: Diagnosis not present

## 2015-03-21 DIAGNOSIS — M542 Cervicalgia: Secondary | ICD-10-CM | POA: Diagnosis not present

## 2015-03-21 DIAGNOSIS — M5413 Radiculopathy, cervicothoracic region: Secondary | ICD-10-CM | POA: Diagnosis not present

## 2015-03-21 DIAGNOSIS — M25562 Pain in left knee: Secondary | ICD-10-CM | POA: Diagnosis not present

## 2015-03-21 DIAGNOSIS — M25561 Pain in right knee: Secondary | ICD-10-CM | POA: Diagnosis not present

## 2015-03-21 DIAGNOSIS — M179 Osteoarthritis of knee, unspecified: Secondary | ICD-10-CM | POA: Diagnosis not present

## 2015-03-21 DIAGNOSIS — M791 Myalgia: Secondary | ICD-10-CM | POA: Diagnosis not present

## 2015-03-21 DIAGNOSIS — M461 Sacroiliitis, not elsewhere classified: Secondary | ICD-10-CM | POA: Diagnosis not present

## 2015-03-26 DIAGNOSIS — M25562 Pain in left knee: Secondary | ICD-10-CM | POA: Diagnosis not present

## 2015-03-26 DIAGNOSIS — M1711 Unilateral primary osteoarthritis, right knee: Secondary | ICD-10-CM | POA: Diagnosis not present

## 2015-03-26 DIAGNOSIS — M179 Osteoarthritis of knee, unspecified: Secondary | ICD-10-CM | POA: Diagnosis not present

## 2015-03-26 DIAGNOSIS — M542 Cervicalgia: Secondary | ICD-10-CM | POA: Diagnosis not present

## 2015-03-26 DIAGNOSIS — M791 Myalgia: Secondary | ICD-10-CM | POA: Diagnosis not present

## 2015-03-26 DIAGNOSIS — M5413 Radiculopathy, cervicothoracic region: Secondary | ICD-10-CM | POA: Diagnosis not present

## 2015-03-26 DIAGNOSIS — M545 Low back pain: Secondary | ICD-10-CM | POA: Diagnosis not present

## 2015-03-26 DIAGNOSIS — M461 Sacroiliitis, not elsewhere classified: Secondary | ICD-10-CM | POA: Diagnosis not present

## 2015-03-26 DIAGNOSIS — M25561 Pain in right knee: Secondary | ICD-10-CM | POA: Diagnosis not present

## 2015-03-28 DIAGNOSIS — M1712 Unilateral primary osteoarthritis, left knee: Secondary | ICD-10-CM | POA: Diagnosis not present

## 2015-03-28 DIAGNOSIS — M179 Osteoarthritis of knee, unspecified: Secondary | ICD-10-CM | POA: Diagnosis not present

## 2015-03-28 DIAGNOSIS — M791 Myalgia: Secondary | ICD-10-CM | POA: Diagnosis not present

## 2015-03-28 DIAGNOSIS — M25561 Pain in right knee: Secondary | ICD-10-CM | POA: Diagnosis not present

## 2015-03-28 DIAGNOSIS — M5413 Radiculopathy, cervicothoracic region: Secondary | ICD-10-CM | POA: Diagnosis not present

## 2015-03-28 DIAGNOSIS — M25562 Pain in left knee: Secondary | ICD-10-CM | POA: Diagnosis not present

## 2015-03-28 DIAGNOSIS — M545 Low back pain: Secondary | ICD-10-CM | POA: Diagnosis not present

## 2015-03-28 DIAGNOSIS — M461 Sacroiliitis, not elsewhere classified: Secondary | ICD-10-CM | POA: Diagnosis not present

## 2015-03-28 DIAGNOSIS — M542 Cervicalgia: Secondary | ICD-10-CM | POA: Diagnosis not present

## 2015-04-02 DIAGNOSIS — M545 Low back pain: Secondary | ICD-10-CM | POA: Diagnosis not present

## 2015-04-02 DIAGNOSIS — M5413 Radiculopathy, cervicothoracic region: Secondary | ICD-10-CM | POA: Diagnosis not present

## 2015-04-02 DIAGNOSIS — M461 Sacroiliitis, not elsewhere classified: Secondary | ICD-10-CM | POA: Diagnosis not present

## 2015-04-02 DIAGNOSIS — M179 Osteoarthritis of knee, unspecified: Secondary | ICD-10-CM | POA: Diagnosis not present

## 2015-04-02 DIAGNOSIS — M25562 Pain in left knee: Secondary | ICD-10-CM | POA: Diagnosis not present

## 2015-04-02 DIAGNOSIS — M1711 Unilateral primary osteoarthritis, right knee: Secondary | ICD-10-CM | POA: Diagnosis not present

## 2015-04-02 DIAGNOSIS — M25561 Pain in right knee: Secondary | ICD-10-CM | POA: Diagnosis not present

## 2015-04-02 DIAGNOSIS — M542 Cervicalgia: Secondary | ICD-10-CM | POA: Diagnosis not present

## 2015-04-02 DIAGNOSIS — M791 Myalgia: Secondary | ICD-10-CM | POA: Diagnosis not present

## 2015-04-04 DIAGNOSIS — M545 Low back pain: Secondary | ICD-10-CM | POA: Diagnosis not present

## 2015-04-04 DIAGNOSIS — M25562 Pain in left knee: Secondary | ICD-10-CM | POA: Diagnosis not present

## 2015-04-04 DIAGNOSIS — M5413 Radiculopathy, cervicothoracic region: Secondary | ICD-10-CM | POA: Diagnosis not present

## 2015-04-04 DIAGNOSIS — M791 Myalgia: Secondary | ICD-10-CM | POA: Diagnosis not present

## 2015-04-04 DIAGNOSIS — M461 Sacroiliitis, not elsewhere classified: Secondary | ICD-10-CM | POA: Diagnosis not present

## 2015-04-04 DIAGNOSIS — M25561 Pain in right knee: Secondary | ICD-10-CM | POA: Diagnosis not present

## 2015-04-04 DIAGNOSIS — M1712 Unilateral primary osteoarthritis, left knee: Secondary | ICD-10-CM | POA: Diagnosis not present

## 2015-04-04 DIAGNOSIS — M179 Osteoarthritis of knee, unspecified: Secondary | ICD-10-CM | POA: Diagnosis not present

## 2015-04-04 DIAGNOSIS — M542 Cervicalgia: Secondary | ICD-10-CM | POA: Diagnosis not present

## 2015-04-09 DIAGNOSIS — M25561 Pain in right knee: Secondary | ICD-10-CM | POA: Diagnosis not present

## 2015-04-09 DIAGNOSIS — M542 Cervicalgia: Secondary | ICD-10-CM | POA: Diagnosis not present

## 2015-04-09 DIAGNOSIS — M1711 Unilateral primary osteoarthritis, right knee: Secondary | ICD-10-CM | POA: Diagnosis not present

## 2015-04-09 DIAGNOSIS — M791 Myalgia: Secondary | ICD-10-CM | POA: Diagnosis not present

## 2015-04-09 DIAGNOSIS — M25562 Pain in left knee: Secondary | ICD-10-CM | POA: Diagnosis not present

## 2015-04-09 DIAGNOSIS — M461 Sacroiliitis, not elsewhere classified: Secondary | ICD-10-CM | POA: Diagnosis not present

## 2015-04-09 DIAGNOSIS — M545 Low back pain: Secondary | ICD-10-CM | POA: Diagnosis not present

## 2015-04-09 DIAGNOSIS — M179 Osteoarthritis of knee, unspecified: Secondary | ICD-10-CM | POA: Diagnosis not present

## 2015-04-09 DIAGNOSIS — M5413 Radiculopathy, cervicothoracic region: Secondary | ICD-10-CM | POA: Diagnosis not present

## 2015-04-11 DIAGNOSIS — M461 Sacroiliitis, not elsewhere classified: Secondary | ICD-10-CM | POA: Diagnosis not present

## 2015-04-11 DIAGNOSIS — M542 Cervicalgia: Secondary | ICD-10-CM | POA: Diagnosis not present

## 2015-04-11 DIAGNOSIS — M179 Osteoarthritis of knee, unspecified: Secondary | ICD-10-CM | POA: Diagnosis not present

## 2015-04-11 DIAGNOSIS — M25561 Pain in right knee: Secondary | ICD-10-CM | POA: Diagnosis not present

## 2015-04-11 DIAGNOSIS — M791 Myalgia: Secondary | ICD-10-CM | POA: Diagnosis not present

## 2015-04-11 DIAGNOSIS — M25562 Pain in left knee: Secondary | ICD-10-CM | POA: Diagnosis not present

## 2015-04-11 DIAGNOSIS — M545 Low back pain: Secondary | ICD-10-CM | POA: Diagnosis not present

## 2015-04-11 DIAGNOSIS — M5413 Radiculopathy, cervicothoracic region: Secondary | ICD-10-CM | POA: Diagnosis not present

## 2015-04-11 DIAGNOSIS — M1712 Unilateral primary osteoarthritis, left knee: Secondary | ICD-10-CM | POA: Diagnosis not present

## 2015-05-11 DIAGNOSIS — J0100 Acute maxillary sinusitis, unspecified: Secondary | ICD-10-CM | POA: Diagnosis not present

## 2015-05-16 DIAGNOSIS — M1712 Unilateral primary osteoarthritis, left knee: Secondary | ICD-10-CM | POA: Diagnosis not present

## 2015-05-16 DIAGNOSIS — M170 Bilateral primary osteoarthritis of knee: Secondary | ICD-10-CM | POA: Diagnosis not present

## 2015-05-16 DIAGNOSIS — M1711 Unilateral primary osteoarthritis, right knee: Secondary | ICD-10-CM | POA: Diagnosis not present

## 2015-10-03 DIAGNOSIS — L570 Actinic keratosis: Secondary | ICD-10-CM | POA: Diagnosis not present

## 2015-10-03 DIAGNOSIS — L918 Other hypertrophic disorders of the skin: Secondary | ICD-10-CM | POA: Diagnosis not present

## 2015-10-03 DIAGNOSIS — L821 Other seborrheic keratosis: Secondary | ICD-10-CM | POA: Diagnosis not present

## 2015-10-03 DIAGNOSIS — D225 Melanocytic nevi of trunk: Secondary | ICD-10-CM | POA: Diagnosis not present

## 2015-10-31 DIAGNOSIS — H2513 Age-related nuclear cataract, bilateral: Secondary | ICD-10-CM | POA: Diagnosis not present

## 2015-10-31 DIAGNOSIS — H40013 Open angle with borderline findings, low risk, bilateral: Secondary | ICD-10-CM | POA: Diagnosis not present

## 2015-10-31 DIAGNOSIS — H524 Presbyopia: Secondary | ICD-10-CM | POA: Diagnosis not present

## 2015-10-31 DIAGNOSIS — H25013 Cortical age-related cataract, bilateral: Secondary | ICD-10-CM | POA: Diagnosis not present

## 2015-11-15 DIAGNOSIS — M170 Bilateral primary osteoarthritis of knee: Secondary | ICD-10-CM | POA: Diagnosis not present

## 2015-11-21 DIAGNOSIS — M170 Bilateral primary osteoarthritis of knee: Secondary | ICD-10-CM | POA: Diagnosis not present

## 2015-11-28 DIAGNOSIS — M170 Bilateral primary osteoarthritis of knee: Secondary | ICD-10-CM | POA: Diagnosis not present

## 2015-12-04 DIAGNOSIS — M25572 Pain in left ankle and joints of left foot: Secondary | ICD-10-CM | POA: Diagnosis not present

## 2015-12-04 DIAGNOSIS — M546 Pain in thoracic spine: Secondary | ICD-10-CM | POA: Diagnosis not present

## 2015-12-04 DIAGNOSIS — M25562 Pain in left knee: Secondary | ICD-10-CM | POA: Diagnosis not present

## 2015-12-04 DIAGNOSIS — M545 Low back pain: Secondary | ICD-10-CM | POA: Diagnosis not present

## 2015-12-04 DIAGNOSIS — M25561 Pain in right knee: Secondary | ICD-10-CM | POA: Diagnosis not present

## 2015-12-04 DIAGNOSIS — M79672 Pain in left foot: Secondary | ICD-10-CM | POA: Diagnosis not present

## 2015-12-06 DIAGNOSIS — M545 Low back pain: Secondary | ICD-10-CM | POA: Diagnosis not present

## 2015-12-06 DIAGNOSIS — M25562 Pain in left knee: Secondary | ICD-10-CM | POA: Diagnosis not present

## 2015-12-06 DIAGNOSIS — M25561 Pain in right knee: Secondary | ICD-10-CM | POA: Diagnosis not present

## 2015-12-06 DIAGNOSIS — M25572 Pain in left ankle and joints of left foot: Secondary | ICD-10-CM | POA: Diagnosis not present

## 2015-12-06 DIAGNOSIS — M79672 Pain in left foot: Secondary | ICD-10-CM | POA: Diagnosis not present

## 2015-12-06 DIAGNOSIS — M546 Pain in thoracic spine: Secondary | ICD-10-CM | POA: Diagnosis not present

## 2015-12-21 DIAGNOSIS — M19072 Primary osteoarthritis, left ankle and foot: Secondary | ICD-10-CM | POA: Diagnosis not present

## 2016-01-17 DIAGNOSIS — M170 Bilateral primary osteoarthritis of knee: Secondary | ICD-10-CM | POA: Diagnosis not present

## 2016-04-21 ENCOUNTER — Encounter: Payer: Self-pay | Admitting: Gastroenterology

## 2016-07-01 DIAGNOSIS — J0100 Acute maxillary sinusitis, unspecified: Secondary | ICD-10-CM | POA: Diagnosis not present

## 2016-10-15 DIAGNOSIS — J45909 Unspecified asthma, uncomplicated: Secondary | ICD-10-CM | POA: Diagnosis not present

## 2016-10-15 DIAGNOSIS — Z131 Encounter for screening for diabetes mellitus: Secondary | ICD-10-CM | POA: Diagnosis not present

## 2016-10-16 DIAGNOSIS — J45909 Unspecified asthma, uncomplicated: Secondary | ICD-10-CM | POA: Diagnosis not present

## 2016-10-17 DIAGNOSIS — R05 Cough: Secondary | ICD-10-CM | POA: Diagnosis not present

## 2016-10-17 DIAGNOSIS — R0602 Shortness of breath: Secondary | ICD-10-CM | POA: Diagnosis not present

## 2016-10-17 DIAGNOSIS — R39198 Other difficulties with micturition: Secondary | ICD-10-CM | POA: Diagnosis not present

## 2016-11-10 DIAGNOSIS — J45909 Unspecified asthma, uncomplicated: Secondary | ICD-10-CM | POA: Diagnosis not present

## 2016-12-05 DIAGNOSIS — L579 Skin changes due to chronic exposure to nonionizing radiation, unspecified: Secondary | ICD-10-CM | POA: Diagnosis not present

## 2016-12-05 DIAGNOSIS — L821 Other seborrheic keratosis: Secondary | ICD-10-CM | POA: Diagnosis not present

## 2016-12-05 DIAGNOSIS — L700 Acne vulgaris: Secondary | ICD-10-CM | POA: Diagnosis not present

## 2017-03-05 DIAGNOSIS — M1712 Unilateral primary osteoarthritis, left knee: Secondary | ICD-10-CM | POA: Diagnosis not present

## 2017-03-05 DIAGNOSIS — M170 Bilateral primary osteoarthritis of knee: Secondary | ICD-10-CM | POA: Diagnosis not present

## 2017-03-05 DIAGNOSIS — M1711 Unilateral primary osteoarthritis, right knee: Secondary | ICD-10-CM | POA: Diagnosis not present

## 2017-03-11 DIAGNOSIS — M170 Bilateral primary osteoarthritis of knee: Secondary | ICD-10-CM | POA: Diagnosis not present

## 2017-03-12 DIAGNOSIS — J45909 Unspecified asthma, uncomplicated: Secondary | ICD-10-CM | POA: Diagnosis not present

## 2017-03-19 DIAGNOSIS — M170 Bilateral primary osteoarthritis of knee: Secondary | ICD-10-CM | POA: Diagnosis not present

## 2017-04-14 ENCOUNTER — Other Ambulatory Visit (HOSPITAL_COMMUNITY): Payer: Self-pay | Admitting: Family Medicine

## 2017-04-14 DIAGNOSIS — R609 Edema, unspecified: Secondary | ICD-10-CM

## 2017-04-14 DIAGNOSIS — E039 Hypothyroidism, unspecified: Secondary | ICD-10-CM | POA: Diagnosis not present

## 2017-04-14 DIAGNOSIS — I10 Essential (primary) hypertension: Secondary | ICD-10-CM | POA: Diagnosis not present

## 2017-04-14 DIAGNOSIS — M19049 Primary osteoarthritis, unspecified hand: Secondary | ICD-10-CM | POA: Diagnosis not present

## 2017-04-15 ENCOUNTER — Ambulatory Visit (HOSPITAL_COMMUNITY)
Admission: RE | Admit: 2017-04-15 | Discharge: 2017-04-15 | Disposition: A | Discharge status: Home / Self Care | Payer: Medicare Other | Source: Ambulatory Visit | Attending: Cardiovascular Disease | Admitting: Cardiovascular Disease

## 2017-04-15 DIAGNOSIS — E785 Hyperlipidemia, unspecified: Secondary | ICD-10-CM | POA: Diagnosis not present

## 2017-04-15 DIAGNOSIS — R609 Edema, unspecified: Secondary | ICD-10-CM

## 2017-04-21 DIAGNOSIS — F349 Persistent mood [affective] disorder, unspecified: Secondary | ICD-10-CM | POA: Diagnosis not present

## 2017-04-21 DIAGNOSIS — I150 Renovascular hypertension: Secondary | ICD-10-CM | POA: Diagnosis not present

## 2017-04-21 DIAGNOSIS — E039 Hypothyroidism, unspecified: Secondary | ICD-10-CM | POA: Diagnosis not present

## 2017-04-27 DIAGNOSIS — Z1231 Encounter for screening mammogram for malignant neoplasm of breast: Secondary | ICD-10-CM | POA: Diagnosis not present

## 2017-04-27 DIAGNOSIS — Z803 Family history of malignant neoplasm of breast: Secondary | ICD-10-CM | POA: Diagnosis not present

## 2017-05-05 DIAGNOSIS — F349 Persistent mood [affective] disorder, unspecified: Secondary | ICD-10-CM | POA: Diagnosis not present

## 2017-05-05 DIAGNOSIS — Z1211 Encounter for screening for malignant neoplasm of colon: Secondary | ICD-10-CM | POA: Diagnosis not present

## 2017-05-05 DIAGNOSIS — M179 Osteoarthritis of knee, unspecified: Secondary | ICD-10-CM | POA: Diagnosis not present

## 2017-05-05 DIAGNOSIS — Z1212 Encounter for screening for malignant neoplasm of rectum: Secondary | ICD-10-CM | POA: Diagnosis not present

## 2017-05-05 DIAGNOSIS — F430 Acute stress reaction: Secondary | ICD-10-CM | POA: Diagnosis not present

## 2017-05-05 DIAGNOSIS — I150 Renovascular hypertension: Secondary | ICD-10-CM | POA: Diagnosis not present

## 2017-05-20 DIAGNOSIS — F349 Persistent mood [affective] disorder, unspecified: Secondary | ICD-10-CM | POA: Diagnosis not present

## 2017-05-20 DIAGNOSIS — E039 Hypothyroidism, unspecified: Secondary | ICD-10-CM | POA: Diagnosis not present

## 2017-05-20 DIAGNOSIS — K219 Gastro-esophageal reflux disease without esophagitis: Secondary | ICD-10-CM | POA: Diagnosis not present

## 2017-05-20 DIAGNOSIS — M797 Fibromyalgia: Secondary | ICD-10-CM | POA: Diagnosis not present

## 2017-05-27 DIAGNOSIS — J45901 Unspecified asthma with (acute) exacerbation: Secondary | ICD-10-CM | POA: Diagnosis not present

## 2017-05-27 DIAGNOSIS — L989 Disorder of the skin and subcutaneous tissue, unspecified: Secondary | ICD-10-CM | POA: Diagnosis not present

## 2017-05-27 DIAGNOSIS — L821 Other seborrheic keratosis: Secondary | ICD-10-CM | POA: Diagnosis not present

## 2017-05-27 DIAGNOSIS — F349 Persistent mood [affective] disorder, unspecified: Secondary | ICD-10-CM | POA: Diagnosis not present

## 2017-06-11 DIAGNOSIS — J45901 Unspecified asthma with (acute) exacerbation: Secondary | ICD-10-CM | POA: Diagnosis not present

## 2017-06-15 DIAGNOSIS — Z780 Asymptomatic menopausal state: Secondary | ICD-10-CM | POA: Diagnosis not present

## 2017-06-24 DIAGNOSIS — Z23 Encounter for immunization: Secondary | ICD-10-CM | POA: Diagnosis not present

## 2017-06-24 DIAGNOSIS — F430 Acute stress reaction: Secondary | ICD-10-CM | POA: Diagnosis not present

## 2017-06-24 DIAGNOSIS — J45901 Unspecified asthma with (acute) exacerbation: Secondary | ICD-10-CM | POA: Diagnosis not present

## 2017-06-24 DIAGNOSIS — M797 Fibromyalgia: Secondary | ICD-10-CM | POA: Diagnosis not present

## 2017-06-24 DIAGNOSIS — M7061 Trochanteric bursitis, right hip: Secondary | ICD-10-CM | POA: Diagnosis not present

## 2017-07-28 DIAGNOSIS — B372 Candidiasis of skin and nail: Secondary | ICD-10-CM | POA: Diagnosis not present

## 2017-07-28 DIAGNOSIS — F430 Acute stress reaction: Secondary | ICD-10-CM | POA: Diagnosis not present

## 2017-07-28 DIAGNOSIS — M179 Osteoarthritis of knee, unspecified: Secondary | ICD-10-CM | POA: Diagnosis not present

## 2017-07-28 DIAGNOSIS — J45901 Unspecified asthma with (acute) exacerbation: Secondary | ICD-10-CM | POA: Diagnosis not present

## 2017-07-30 DIAGNOSIS — L820 Inflamed seborrheic keratosis: Secondary | ICD-10-CM | POA: Diagnosis not present

## 2017-07-30 DIAGNOSIS — L821 Other seborrheic keratosis: Secondary | ICD-10-CM | POA: Diagnosis not present

## 2017-08-19 DIAGNOSIS — J45909 Unspecified asthma, uncomplicated: Secondary | ICD-10-CM | POA: Diagnosis not present

## 2017-08-19 DIAGNOSIS — I10 Essential (primary) hypertension: Secondary | ICD-10-CM | POA: Diagnosis not present

## 2017-08-19 DIAGNOSIS — E785 Hyperlipidemia, unspecified: Secondary | ICD-10-CM | POA: Diagnosis not present

## 2017-08-19 DIAGNOSIS — E039 Hypothyroidism, unspecified: Secondary | ICD-10-CM | POA: Diagnosis not present

## 2018-04-05 ENCOUNTER — Other Ambulatory Visit: Payer: Self-pay | Admitting: Family Medicine

## 2018-04-05 DIAGNOSIS — R109 Unspecified abdominal pain: Secondary | ICD-10-CM

## 2018-04-06 ENCOUNTER — Ambulatory Visit
Admission: RE | Admit: 2018-04-06 | Discharge: 2018-04-06 | Disposition: A | Discharge status: Home / Self Care | Payer: Medicare Other | Source: Ambulatory Visit | Attending: Family Medicine | Admitting: Family Medicine

## 2018-04-06 DIAGNOSIS — R109 Unspecified abdominal pain: Secondary | ICD-10-CM

## 2018-06-23 ENCOUNTER — Other Ambulatory Visit: Payer: Self-pay | Admitting: Family Medicine

## 2018-06-23 DIAGNOSIS — K760 Fatty (change of) liver, not elsewhere classified: Secondary | ICD-10-CM

## 2018-06-23 DIAGNOSIS — K8020 Calculus of gallbladder without cholecystitis without obstruction: Secondary | ICD-10-CM

## 2018-06-23 DIAGNOSIS — K824 Cholesterolosis of gallbladder: Secondary | ICD-10-CM

## 2018-06-23 DIAGNOSIS — R52 Pain, unspecified: Secondary | ICD-10-CM

## 2018-06-24 ENCOUNTER — Ambulatory Visit
Admission: RE | Admit: 2018-06-24 | Discharge: 2018-06-24 | Disposition: A | Discharge status: Home / Self Care | Payer: Medicare Other | Source: Ambulatory Visit | Attending: Family Medicine | Admitting: Family Medicine

## 2018-06-24 DIAGNOSIS — K760 Fatty (change of) liver, not elsewhere classified: Secondary | ICD-10-CM

## 2018-06-24 DIAGNOSIS — K824 Cholesterolosis of gallbladder: Secondary | ICD-10-CM

## 2018-06-24 DIAGNOSIS — K8020 Calculus of gallbladder without cholecystitis without obstruction: Secondary | ICD-10-CM

## 2018-06-24 DIAGNOSIS — R52 Pain, unspecified: Secondary | ICD-10-CM

## 2018-07-20 ENCOUNTER — Other Ambulatory Visit: Payer: Self-pay | Admitting: Family Medicine

## 2018-07-20 DIAGNOSIS — E785 Hyperlipidemia, unspecified: Secondary | ICD-10-CM

## 2018-07-20 DIAGNOSIS — I10 Essential (primary) hypertension: Secondary | ICD-10-CM

## 2018-07-26 ENCOUNTER — Other Ambulatory Visit: Payer: Medicare Other

## 2018-08-05 ENCOUNTER — Other Ambulatory Visit (HOSPITAL_COMMUNITY): Payer: Self-pay | Admitting: Family Medicine

## 2018-08-05 DIAGNOSIS — I10 Essential (primary) hypertension: Secondary | ICD-10-CM

## 2018-08-05 DIAGNOSIS — E785 Hyperlipidemia, unspecified: Secondary | ICD-10-CM

## 2018-08-06 ENCOUNTER — Other Ambulatory Visit: Payer: Self-pay | Admitting: Family Medicine

## 2018-08-06 DIAGNOSIS — I2584 Coronary atherosclerosis due to calcified coronary lesion: Principal | ICD-10-CM

## 2018-08-06 DIAGNOSIS — I2510 Atherosclerotic heart disease of native coronary artery without angina pectoris: Secondary | ICD-10-CM

## 2018-08-18 ENCOUNTER — Ambulatory Visit (HOSPITAL_COMMUNITY)
Admission: RE | Admit: 2018-08-18 | Discharge: 2018-08-18 | Disposition: A | Discharge status: Home / Self Care | Payer: Medicare Other | Source: Ambulatory Visit | Attending: Family Medicine | Admitting: Family Medicine

## 2018-08-18 DIAGNOSIS — I2510 Atherosclerotic heart disease of native coronary artery without angina pectoris: Secondary | ICD-10-CM | POA: Diagnosis not present

## 2018-08-18 DIAGNOSIS — I2584 Coronary atherosclerosis due to calcified coronary lesion: Secondary | ICD-10-CM | POA: Insufficient documentation

## 2018-09-16 ENCOUNTER — Encounter: Payer: Self-pay | Admitting: Cardiology

## 2018-09-16 ENCOUNTER — Encounter

## 2018-09-16 ENCOUNTER — Ambulatory Visit (INDEPENDENT_AMBULATORY_CARE_PROVIDER_SITE_OTHER): Payer: Medicare Other | Admitting: Cardiology

## 2018-09-16 VITALS — BP 160/90 | HR 56 | Ht 62.0 in | Wt 206.0 lb

## 2018-09-16 DIAGNOSIS — R079 Chest pain, unspecified: Secondary | ICD-10-CM | POA: Insufficient documentation

## 2018-09-16 DIAGNOSIS — I2584 Coronary atherosclerosis due to calcified coronary lesion: Secondary | ICD-10-CM | POA: Diagnosis not present

## 2018-09-16 DIAGNOSIS — I2510 Atherosclerotic heart disease of native coronary artery without angina pectoris: Secondary | ICD-10-CM

## 2018-09-16 DIAGNOSIS — I313 Pericardial effusion (noninflammatory): Secondary | ICD-10-CM

## 2018-09-16 DIAGNOSIS — I3139 Other pericardial effusion (noninflammatory): Secondary | ICD-10-CM | POA: Insufficient documentation

## 2018-09-16 NOTE — Progress Notes (Signed)
Cardiology Office Note   Date:  09/16/2018   ID:  Amy Blackwell, DOB [birth year], MRN [id number]  PCP:  Hayden Rasmussen, MD  Cardiologist:   No primary care provider on file. Referring:  Hayden Rasmussen, MD  No chief complaint on file.     History of Present Illness: Amy Blackwell is a 68 y.o. female who is referred by Hayden Rasmussen, MD secondary to evidence of small pericardial effusion and some coronary calcification noted on a CT. the patient reports having had a nuclear test it sounds like years ago.  I do not have these records.  She is recently had some problems with some right epigastric discomfort.  She was found to have gallbladder polyps.  She had fatty liver infiltration and it was thought that she was having cholecystitis.  She managed it by using extra virgin olive oil.  when she had follow-up ultrasound in October, 2 months after the original, there was not a Murphy's sign and I do not see mention of polyps.  She is continued however to have this kind of epigastric discomfort.  She was sent for a coronary calcium score.  This was 28 which is 73% change.  She was also found to have a small pericardial effusion.  She denies any substernal chest pressure, neck or arm discomfort.  She has not had new shortness of breath, PND or apnea.  She has had no palpitations, presyncope or syncope.  She says she is been on the keto and has lost 30 pounds.  Past Medical History:  Diagnosis Date  . Arthritis   . Bursitis   . Hypercholesterolemia   . Osteoporosis   . Thyroid disease     Past Surgical History:  Procedure Laterality Date  . EYE SURGERY    . KNEE SURGERY       Current Outpatient Medications  Medication Sig Dispense Refill  . diclofenac sodium (VOLTAREN) 1 % GEL Apply 2 g topically 4 (four) times daily. Rub into affected area of foot 2 to 4 times daily 100 g 2  . Fluticasone-Salmeterol (ADVAIR DISKUS) 500-50 MCG/DOSE AEPB Inhale 1 puff into the lungs 2 (two)  times daily.    Marland Kitchen levothyroxine (SYNTHROID, LEVOTHROID) 50 MCG tablet     . losartan (COZAAR) 25 MG tablet losartan 25 mg tablet    . meloxicam (MOBIC) 15 MG tablet Take 15 mg by mouth daily.    Marland Kitchen omeprazole (PRILOSEC) 40 MG capsule     . oxyCODONE-acetaminophen (PERCOCET/ROXICET) 5-325 MG per tablet Take 2 tablets by mouth every 4 (four) hours as needed for severe pain. 15 tablet 0  . pravastatin (PRAVACHOL) 10 MG tablet Take 40 mg by mouth daily.     Marland Kitchen PROAIR HFA 108 (90 BASE) MCG/ACT inhaler      No current facility-administered medications for this visit.     Allergies:   Codeine and Shellfish allergy    Social History:  The patient  reports that she has never smoked. She has never used smokeless tobacco. She reports that she does not drink alcohol or use drugs.   Family History:  The patient's family history includes CAD (age of onset: 70) in her brother; Dementia in her mother; Peripheral vascular disease in her father; Prostate cancer in her father.    ROS:  Please see the history of present illness.   Otherwise, review of systems are positive for none.   All other systems are reviewed and negative.  PHYSICAL EXAM: VS:  BP (!) 160/90   Pulse (!) 56   Ht 5' 2"  (1.575 m)   Wt 206 lb (93.4 kg)   BMI 37.68 kg/m  , BMI Body mass index is 37.68 kg/m. GENERAL:  Well appearing HEENT:  Pupils equal round and reactive, fundi not visualized, oral mucosa unremarkable NECK:  No jugular venous distention, waveform within normal limits, carotid upstroke brisk and symmetric, no bruits, no thyromegaly LYMPHATICS:  No cervical, inguinal adenopathy LUNGS:  Clear to auscultation bilaterally BACK:  No CVA tenderness CHEST:  Unremarkable HEART:  PMI not displaced or sustained,S1 and S2 within normal limits, no S3, no S4, no clicks, no rubs, YQ65 murmurs ABD:  Flat, positive bowel sounds normal in frequency in pitch, no bruits, no rebound, no guarding, no midline pulsatile mass, no  hepatomegaly, no splenomegaly EXT:  2 plus pulses throughout, no edema, no cyanosis no clubbing SKIN:  No rashes no nodules NEURO:  Cranial nerves II through XII grossly intact, motor grossly intact throughout PSYCH:  Cognitively intact, oriented to person place and time    EKG:  EKG is ordered today. The ekg ordered today demonstrates sinus rhythm, rate 56, axis within normal limits, low voltage in the limb and chest leads, poor anterior R wave progression, no acute ST-T wave changes.   Recent Labs: No results found for requested labs within last 8760 hours.    Lipid Panel No results found for: CHOL, TRIG, HDL, CHOLHDL, VLDL, LDLCALC, LDLDIRECT    Wt Readings from Last 3 Encounters:  09/16/18 206 lb (93.4 kg)  02/09/14 250 lb (113.4 kg)      Other studies Reviewed: Additional studies/ records that were reviewed today include: CT. Review of the above records demonstrates:  Please see elsewhere in the note.     ASSESSMENT AND PLAN:  CORONARY CALCIUM: She does not have any symptoms necessarily consistent with obstructive disease but she does have an elevated coronary calcium score for her age and gender. I will bring the patient back for a POET (Plain Old Exercise Test). This will allow me to screen for obstructive coronary disease, risk stratify and very importantly provide a prescription for exercise.   PERICARDIAL EFFUSION: Given this finding and the low voltage on her EKG will pursue an echocardiogram.  HTN:    Her blood pressure is elevated today but she says it is usually well treated at home.  Should keep a blood pressure diary.  No change in therapy at this time  DYSLIPIDEMIA:   I do not have her recent lipid profile but she says it was very elevated but she was using a lot of all of oil.  She is going to have this followed by Hayden Rasmussen, MD   CHEST PAIN: The pain she is having is more back and right-sided and does not sound like obstructive coronary disease but I  am going to be evaluating because of the coronary calcium as above.  I will otherwise refer her back to her primary physician.  OVERWEIGHT:   I congratulated her on her weight loss and encouraged more of the same.   Current medicines are reviewed at length with the patient today.  The patient does not have concerns regarding medicines.  The following changes have been made:  no change  Labs/ tests ordered today include:   Orders Placed This Encounter  Procedures  . EXERCISE TOLERANCE TEST (ETT)  . EKG 12-Lead  . ECHOCARDIOGRAM COMPLETE     Disposition:  FU with me as needed    Signed, Minus Breeding, MD  09/16/2018 3:45 PM    [facility]

## 2018-09-16 NOTE — Patient Instructions (Signed)
Medication Instructions:  CONTINUE CURRENT MEDICATION. If you need a refill on your cardiac medications before your next appointment, please call your pharmacy.  Labwork: NONE HERE IN OUR OFFICE AT [facility]  You will need to fast. DO NOT EAT OR DRINK PAST MIDNIGHT.     You will NOT need to fast   Take the provided lab slips with you to the lab for your blood draw.  When you have your labs (blood work) drawn today and your tests are completely normal, you will receive your results only by MyChart Message (if you have MyChart) -OR-  A paper copy in the mail.  If you have any lab test that is abnormal or we need to change your treatment, we will call you to review these results.  Testing/Procedures: Your physician has requested that you have an exercise tolerance test. For further information please visit [email or web address]. Please also follow instruction sheet, as given.  Your physician has requested that you have an echocardiogram. Echocardiography is a painless test that uses sound waves to create images of your heart. It provides your doctor with information about the size and shape of your heart and how well your heart's chambers and valves are working. This procedure takes approximately one hour. There are no restrictions for this procedure.    Follow-Up: Your physician recommends that you schedule a follow-up appointment AS NEEDED.  At [facility], you and your health needs are our priority.  As part of our continuing mission to provide you with exceptional heart care, we have created designated Provider Care Teams.  These Care Teams include your primary Cardiologist (physician) and Advanced Practice Providers (APPs -  Physician Assistants and Nurse Practitioners) who all work together to provide you with the care you need, when you need it.  Thank you for choosing [facility] at [facility]!!

## 2018-09-17 ENCOUNTER — Telehealth (HOSPITAL_COMMUNITY): Payer: Self-pay

## 2018-09-17 NOTE — Telephone Encounter (Signed)
Encounter complete. 

## 2018-09-21 ENCOUNTER — Ambulatory Visit (HOSPITAL_COMMUNITY)
Admission: RE | Admit: 2018-09-21 | Discharge: 2018-09-21 | Disposition: A | Discharge status: Home / Self Care | Payer: Medicare Other | Source: Ambulatory Visit | Attending: Cardiology | Admitting: Cardiology

## 2018-09-21 ENCOUNTER — Other Ambulatory Visit: Payer: Self-pay

## 2018-09-21 ENCOUNTER — Ambulatory Visit (HOSPITAL_BASED_OUTPATIENT_CLINIC_OR_DEPARTMENT_OTHER): Payer: Medicare Other

## 2018-09-21 DIAGNOSIS — I3139 Other pericardial effusion (noninflammatory): Secondary | ICD-10-CM

## 2018-09-21 DIAGNOSIS — I313 Pericardial effusion (noninflammatory): Secondary | ICD-10-CM | POA: Diagnosis not present

## 2018-09-21 LAB — EXERCISE TOLERANCE TEST
CHL RATE OF PERCEIVED EXERTION: 19
Estimated workload: 6.1 METS
Exercise duration (min): 4 min
Exercise duration (sec): 15 s
MPHR: 151 {beats}/min
Peak HR: 141 {beats}/min
Percent HR: 93 %
Rest HR: 51 {beats}/min

## 2018-09-27 ENCOUNTER — Telehealth: Payer: Self-pay | Admitting: Cardiology

## 2018-09-27 ENCOUNTER — Telehealth: Payer: Self-pay | Admitting: *Deleted

## 2018-09-27 DIAGNOSIS — R079 Chest pain, unspecified: Secondary | ICD-10-CM

## 2018-09-27 NOTE — Telephone Encounter (Signed)
-----   Message from Minus Breeding, MD sent at 09/26/2018 10:12 AM EST ----- This was a non diagnostic POET (Plain Old Exercise Treadmill) because of the frequent ectopy.  She needs a Lexicographer.  Please call her to arrange this.  Call Ms. Amy Blackwell with the results and send results to Hayden Rasmussen, MD

## 2018-09-27 NOTE — Telephone Encounter (Signed)
Spoke with pt and informed that echo result note not yet available from Dr. Percival Spanish, but that she would be called with results when available. Pt verbalized understanding. Pt had questions about upcoming  Myocardial perfusion imaging study and any special instructions. Informed pt that she would be connected to EKG to monitor heart when injected with lexiscan to increase HR and also injected with radioactive isotope for imaging in special machine that will take pictures of her heart. Educated pt on instructions for myocardial perfusion imaging study. Pt verbalized understanding

## 2018-09-27 NOTE — Telephone Encounter (Signed)
lexiscan myo ordered and send to scheduler to be schedule

## 2018-09-27 NOTE — Telephone Encounter (Signed)
Patient would like results to her Echo.

## 2018-09-28 ENCOUNTER — Telehealth (HOSPITAL_COMMUNITY): Payer: Self-pay

## 2018-09-28 NOTE — Telephone Encounter (Signed)
Encounter complete. 

## 2018-09-29 ENCOUNTER — Ambulatory Visit (HOSPITAL_COMMUNITY)
Admission: RE | Admit: 2018-09-29 | Discharge: 2018-09-29 | Disposition: A | Discharge status: Home / Self Care | Payer: Medicare Other | Source: Ambulatory Visit | Attending: Cardiovascular Disease | Admitting: Cardiovascular Disease

## 2018-09-29 DIAGNOSIS — R079 Chest pain, unspecified: Secondary | ICD-10-CM | POA: Diagnosis not present

## 2018-09-29 LAB — MYOCARDIAL PERFUSION IMAGING
CHL CUP NUCLEAR SSS: 3
CHL CUP RESTING HR STRESS: 51 {beats}/min
LV sys vol: 29 mL
LVDIAVOL: 81 mL (ref 46–106)
Peak HR: 83 {beats}/min
SDS: 3
SRS: 0
TID: 1.15

## 2018-09-29 MED ORDER — TECHNETIUM TC 99M TETROFOSMIN IV KIT
29.2000 | PACK | Freq: Once | INTRAVENOUS | Status: AC | PRN
  Administered 2018-09-29: 29.2 via INTRAVENOUS
  Filled 2018-09-29: qty 30

## 2018-09-29 MED ORDER — REGADENOSON 0.4 MG/5ML IV SOLN
0.4000 mg | Freq: Once | INTRAVENOUS | Status: AC
  Administered 2018-09-29: 0.4 mg via INTRAVENOUS

## 2018-09-29 MED ORDER — TECHNETIUM TC 99M TETROFOSMIN IV KIT
10.4000 | PACK | Freq: Once | INTRAVENOUS | Status: AC | PRN
  Administered 2018-09-29: 10.4 via INTRAVENOUS
  Filled 2018-09-29: qty 11

## 2018-10-05 ENCOUNTER — Telehealth: Payer: Self-pay | Admitting: *Deleted

## 2018-10-05 NOTE — Telephone Encounter (Signed)
  Advised patient, verbalized understanding.    Notes recorded by Minus Breeding, MD on 09/28/2018 at 11:34 AM EST Normal LV function. No significant abnormalities. No change in therapy. Call [name] with the results and send results to Hayden Rasmussen, MD

## 2018-10-27 ENCOUNTER — Ambulatory Visit: Payer: Medicare Other | Admitting: Cardiovascular Disease

## 2019-01-28 ENCOUNTER — Other Ambulatory Visit: Payer: Self-pay | Admitting: Family Medicine

## 2019-01-28 DIAGNOSIS — K7581 Nonalcoholic steatohepatitis (NASH): Secondary | ICD-10-CM

## 2019-01-31 ENCOUNTER — Other Ambulatory Visit: Payer: Self-pay | Admitting: Family Medicine

## 2019-01-31 DIAGNOSIS — R52 Pain, unspecified: Secondary | ICD-10-CM

## 2019-02-14 ENCOUNTER — Other Ambulatory Visit: Payer: Self-pay | Admitting: Family Medicine

## 2019-02-14 ENCOUNTER — Ambulatory Visit
Admission: RE | Admit: 2019-02-14 | Discharge: 2019-02-14 | Disposition: A | Discharge status: Home / Self Care | Payer: Medicare Other | Source: Ambulatory Visit | Attending: Family Medicine | Admitting: Family Medicine

## 2019-02-14 DIAGNOSIS — R52 Pain, unspecified: Secondary | ICD-10-CM

## 2019-02-14 DIAGNOSIS — K7581 Nonalcoholic steatohepatitis (NASH): Secondary | ICD-10-CM

## 2019-04-27 ENCOUNTER — Other Ambulatory Visit: Payer: Self-pay

## 2019-04-27 DIAGNOSIS — Z20822 Contact with and (suspected) exposure to covid-19: Secondary | ICD-10-CM

## 2019-04-28 LAB — SPECIMEN STATUS REPORT

## 2019-04-28 LAB — NOVEL CORONAVIRUS, NAA: SARS-CoV-2, NAA: NOT DETECTED

## 2019-05-06 ENCOUNTER — Other Ambulatory Visit: Payer: Self-pay

## 2019-05-06 DIAGNOSIS — Z20822 Contact with and (suspected) exposure to covid-19: Secondary | ICD-10-CM

## 2019-05-08 LAB — NOVEL CORONAVIRUS, NAA: SARS-CoV-2, NAA: DETECTED — AB

## 2019-06-21 ENCOUNTER — Encounter: Payer: Self-pay | Admitting: Cardiology

## 2019-06-21 ENCOUNTER — Telehealth (INDEPENDENT_AMBULATORY_CARE_PROVIDER_SITE_OTHER): Payer: Medicare Other | Admitting: Cardiology

## 2019-06-21 DIAGNOSIS — I2584 Coronary atherosclerosis due to calcified coronary lesion: Secondary | ICD-10-CM

## 2019-06-21 DIAGNOSIS — I2510 Atherosclerotic heart disease of native coronary artery without angina pectoris: Secondary | ICD-10-CM | POA: Diagnosis not present

## 2019-06-21 NOTE — Progress Notes (Signed)
Virtual Visit via Video Note   This visit type was conducted due to national recommendations for restrictions regarding the COVID-19 Pandemic (e.g. social distancing) in an effort to limit this patient's exposure and mitigate transmission in our community.  Due to her co-morbid illnesses, this patient is at least at moderate risk for complications without adequate follow up.  This format is felt to be most appropriate for this patient at this time.  All issues noted in this document were discussed and addressed.  A limited physical exam was performed with this format.  Please refer to the patient's chart for her consent to telehealth for [facility].   Date:  06/21/2019   ID:  Amy Blackwell, DOB 12-23-1950, MRN [id number]  Patient Location: Home Provider Location: Home  PCP:  Hayden Rasmussen, MD  Cardiologist:  Minus Breeding, MD  Electrophysiologist:  None   Evaluation Performed:  Follow-Up Visit  Chief Complaint:  Chest pain  History of Present Illness:    Amy Blackwell is a 68 y.o. female with a history of chest pain. She had problems with fatty liver and gallbladder polyps.  She was referred by Hayden Rasmussen, MD secondary to evidence of small pericardial effusion and some coronary calcification noted on a CT. I sent her for a Lexiscan Myoview in January.  This was negative for ischemia.  She had an echo with only trivial effusion.    She is continuing to have chest discomfort.  She was referred back for this.  She did not really remember meeting me the portal or having the studies.  I had to prompt her several times to get her to recall that she had an echo and a stress test.  She reports chest discomfort but digging into this it seems that this is unchanged from when I saw her earlier this year.  She has sharp discomfort under her breasts.  She has some discomfort under her left shoulder blade.  The discomfort under her breast also feels like a band tightening.  It happens  sporadically.  The discomfort in her back happens when she stands for too long.  Such as in the kitchen cooking.  She has been exercising recently going to a pool and doing aerobics.  She does not bring on any chest discomfort with this.  She feels great with this and does not bring on any back discomfort.  She is lost weight in an effort to control her fatty liver.  She was consuming a lot of all of oil so her cholesterol went up but she said she had resolution of her fatty liver.  She is not describing associated shortness of breath with her chest or back discomfort.  She is not having any palpitations, presyncope or syncope.  She denies any PND orthopnea.   The patient does not have symptoms concerning for COVID-19 infection (fever, chills, cough, or new shortness of breath).    Past Medical History:  Diagnosis Date  . Arthritis   . Bursitis   . Hypercholesterolemia   . Osteoporosis   . Thyroid disease    Past Surgical History:  Procedure Laterality Date  . EYE SURGERY    . KNEE SURGERY       Current Meds  Medication Sig  . albuterol (VENTOLIN HFA) 108 (90 Base) MCG/ACT inhaler Inhale into the lungs as needed for wheezing or shortness of breath.  Marland Kitchen aspirin EC 81 MG tablet Take 81 mg by mouth daily.  . clonazePAM (KLONOPIN)  1 MG tablet Take 1 mg by mouth at bedtime as needed for anxiety.  . Fluticasone-Salmeterol (ADVAIR) 500-50 MCG/DOSE AEPB Inhale 1 puff into the lungs as needed.  Marland Kitchen levothyroxine (SYNTHROID) 88 MCG tablet Take 88 mcg by mouth daily before breakfast.  . losartan (COZAAR) 25 MG tablet losartan 25 mg tablet  . meloxicam (MOBIC) 15 MG tablet Take 15 mg by mouth as needed.   . metoprolol succinate (TOPROL-XL) 25 MG 24 hr tablet Take 25 mg by mouth daily.  . rosuvastatin (CRESTOR) 20 MG tablet Take 20 mg by mouth daily.  . [DISCONTINUED] levothyroxine (SYNTHROID, LEVOTHROID) 50 MCG tablet      Allergies:   Codeine and Shellfish allergy   Social History   Tobacco  Use  . Smoking status: Never Smoker  . Smokeless tobacco: Never Used  Substance Use Topics  . Alcohol use: No  . Drug use: No     Family Hx: The patient's family history includes CAD (age of onset: 52) in her brother; Dementia in her mother; Peripheral vascular disease in her father; Prostate cancer in her father.  ROS:   Please see the history of present illness.     All other systems reviewed and are negative.   Prior CV studies:   The following studies were reviewed today:  Echo and Lexiscan Myoview.  Labs/Other Tests and Data Reviewed:    EKG:  No ECG reviewed.  Recent Labs: No results found for requested labs within last 8760 hours.   Recent Lipid Panel No results found for: CHOL, TRIG, HDL, CHOLHDL, LDLCALC, LDLDIRECT  Wt Readings from Last 3 Encounters:  09/29/18 206 lb (93.4 kg)  09/16/18 206 lb (93.4 kg)  02/09/14 250 lb (113.4 kg)     Objective:    Vital Signs:  BP 127/63   Pulse (!) 58   Ht 5' 1"  (1.549 m)   SpO2 96%   BMI 38.92 kg/m    VITAL SIGNS:  reviewed GEN:  no acute distress EYES:  sclerae anicteric, EOMI - Extraocular Movements Intact NEURO:  alert and oriented x 3, no obvious focal deficit PSYCH:  normal affect  ASSESSMENT & PLAN:     PERICARDIAL EFFUSION:  I reviewed with her the results of her echocardiogram.  There is no suggestion by her history that she is having symptoms related to this.  She is not describing pleuritic type pain.  I do not think further imaging is indicated.    HTN:    Her blood pressure is controlled.  She will continue the meds as listed.    DYSLIPIDEMIA:    We discussed a Mediterranean diet and she is having this lipid followed by her primary provider.    CHEST PAIN: The patient had chest pain as described above but this is unchanged from earlier this year.  She does have some coronary calcification.  We talked at length about primary prevention measures.  However, given the negative perfusion study  earlier this year and the fact that her symptoms have not changed and are quite atypical I do not think further assessment for obstructive coronary disease is warranted.  She and I had a long discussion about this and she would let me know if her pain pattern changes.  OVERWEIGHT:    She has had wonderful weight loss and I congratulated her on this and encouraged more the same.  COVID-19 Education: The signs and symptoms of COVID-19 were discussed with the patient and how to seek care for testing (follow  up with PCP or arrange E-visit).  The importance of social distancing was discussed today.  Time:   Today, I have spent 18 minutes with the patient with telehealth technology discussing the above problems.     Medication Adjustments/Labs and Tests Ordered: Current medicines are reviewed at length with the patient today.  Concerns regarding medicines are outlined above.   Tests Ordered: No orders of the defined types were placed in this encounter.   Medication Changes: No orders of the defined types were placed in this encounter.   Follow Up:  With me prn  Signed, Minus Breeding, MD  06/21/2019 10:04 AM    [place]

## 2019-06-21 NOTE — Patient Instructions (Addendum)
Medication Instructions:  Your physician recommends that you continue on your current medications as directed. Please refer to the Current Medication list given to you today.  *If you need a refill on your cardiac medications before your next appointment, please call your pharmacy*  Lab Work: NONE  Testing/Procedures: NONE  Follow-Up: At [facility], you and your health needs are our priority.  As part of our continuing mission to provide you with exceptional heart care, we have created designated Provider Care Teams.  These Care Teams include your primary Cardiologist (physician) and Advanced Practice Providers (APPs -  Physician Assistants and Nurse Practitioners) who all work together to provide you with the care you need, when you need it.  Your next appointment:   AS NEEDED

## 2020-03-11 NOTE — Progress Notes (Signed)
Cardiology Office Note   Date:  03/13/2020   ID:  Amy Blackwell, DOB [birth year], MRN [id number]  PCP:  Hayden Rasmussen, MD  Cardiologist: Dr. Percival Spanish  CC: Follow Up    History of Present Illness: Amy Blackwell is a 69 y.o. female who presents for ongoing assessment and management of chest discomfort.  She has a history of a pericardial effusion and some coronary calcification noted on CT.  She also has chronic back pain and discomfort under her breasts feeling like a band tightening.  This happens sporadically.  [name] had a nuclear medicine stress test on 09/29/2018 which was negative and found to be low risk.  No further invasive testing was planned.  She also had an echocardiogram on 09/21/2018 which revealed normal LV systolic function of 21% to 60%.  She did have mild left ventricular relaxation with Grade 1 diastolic dysfunction.  When last seen by Dr. Percival Spanish on 06/21/2019 he did not plan for any further testing at that time.  Her blood pressure was well controlled.  She was to report if she had any new symptoms.  She was to continue weight loss and exercise as she was purposely trying to lose weight.  Since being seen last the patient has lost a total of 91 pounds.  She has been on a keto diet and also swims every day.  She comes today because she has noticed a significant decrease in her heart rate and blood pressure.  She is been taken off of antihypertensive medications.  She has also been taking magnesium supplements and multivitamins.  On review of her blood pressure record, blood pressure has been running as low as 88/69 to highest at 120/82.  Heart rate is been ranging from 68-49.  She has been complaining of some mild chest tightness, some dizziness.  She stopped swimming because she was feeling badly.  She occasionally feels chest tightness when at rest.  While swimming she did feel some chest tightness as well.  Is described as a "band like pain", lasting several minutes  and going away on its own.  She is feeling it less often since she stopped swimming but as stated she has occasionally felt it at rest.  She denies associated diaphoresis, dyspnea on exertion,, near syncope or syncopal episodes.  Past Medical History:  Diagnosis Date  . Arthritis   . Bursitis   . Hypercholesterolemia   . Osteoporosis   . Thyroid disease     Past Surgical History:  Procedure Laterality Date  . EYE SURGERY    . KNEE SURGERY       Current Outpatient Medications  Medication Sig Dispense Refill  . albuterol (VENTOLIN HFA) 108 (90 Base) MCG/ACT inhaler Inhale into the lungs as needed for wheezing or shortness of breath.    . Fluticasone-Salmeterol (ADVAIR) 500-50 MCG/DOSE AEPB Inhale 1 puff into the lungs as needed.    Marland Kitchen levothyroxine (SYNTHROID) 88 MCG tablet Take 88 mcg by mouth daily before breakfast.     No current facility-administered medications for this visit.    Allergies:   Codeine and Shellfish allergy    Social History:  The patient  reports that she has never smoked. She has never used smokeless tobacco. She reports that she does not drink alcohol and does not use drugs.   Family History:  The patient's family history includes CAD (age of onset: 86) in her brother; Dementia in her mother; Peripheral vascular disease in her father; Prostate cancer in her  father.    ROS: All other systems are reviewed and negative. Unless otherwise mentioned in H&P    PHYSICAL EXAM: VS:  BP (!) 158/76   Pulse (!) 47   Ht 5' 1"  (1.549 m)   Wt 200 lb 9.6 oz (91 kg)   BMI 37.90 kg/m  , BMI Body mass index is 37.9 kg/m. GEN: Well nourished, well developed, in no acute distress HEENT: normal Neck: no JVD, carotid bruits, or masses Cardiac: RRR; bradycardic no murmurs, rubs, or gallops,no edema  Respiratory:  Clear to auscultation bilaterally, normal work of breathing, very mild bibasilar crackles, no wheezing or coughing. GI: soft, nontender, nondistended, +  BS MS: no deformity or atrophy Skin: warm and dry, no rash Neuro:  Strength and sensation are intact Psych: euthymic mood, full affect   EKG: Marked sinus bradycardia heart rate of 47 bpm with incomplete right bundle branch block.  PR interval 162 ms.  (Personally reviewed)  Recent Labs: No results found for requested labs within last 8760 hours.    Lipid Panel No results found for: CHOL, TRIG, HDL, CHOLHDL, VLDL, LDLCALC, LDLDIRECT    Wt Readings from Last 3 Encounters:  03/13/20 200 lb 9.6 oz (91 kg)  09/29/18 206 lb (93.4 kg)  09/16/18 206 lb (93.4 kg)      Other studies Reviewed: Echocardiogram 16-Oct-2018 Left ventricle: The cavity size was normal. There was mild focal  basal hypertrophy of the septum. Systolic function was normal.  The estimated ejection fraction was in the range of 55% to 60%.  Wall motion was normal; there were no regional wall motion  abnormalities. Doppler parameters are consistent with abnormal  left ventricular relaxation (grade 1 diastolic dysfunction).  Doppler parameters are consistent with indeterminate ventricular  filling pressure.  - Aortic valve: Transvalvular velocity was within the normal range.  There was no stenosis. There was no regurgitation.  - Mitral valve: Transvalvular velocity was within the normal range.  There was no evidence for stenosis. There was trivial  regurgitation.  - Left atrium: The atrium was mildly dilated.  - Right ventricle: The cavity size was normal. Wall thickness was  normal. Systolic function was normal.  - Atrial septum: No defect or patent foramen ovale was identified  by color flow Doppler.  - Tricuspid valve: There was mild regurgitation.  - Pulmonary arteries: Systolic pressure was within the normal  range. PA peak pressure: 23 mm Hg (S).  - Pericardium, extracardiac: A trivial pericardial effusion was  identified.    NM Stress Test 09/29/2018     The left  ventricular ejection fraction is normal (55-65%).  Nuclear stress EF: 64%. No wall motion abnormalities.  There was no ST segment deviation noted during stress.  This is a low risk study. Mild apical breast attenuation artifact noted. No ischemia or infarct identified.    ASSESSMENT AND PLAN:  1.  Bradycardia: Unclear if the symptoms she is having are related to this lower heart rate.  She has noticed some bandlike chest tightness.  Usually occurring with exertion, but has occurred at rest.  I will place a ZIO cardiac monitor for 1 week, continuous synchronous recording.  I will also check a TSH, T3 and T4 as she has a history of hypothyroidism and has not had recent lab completed by her primary care provider in many months.  May need to consider POET to assess for chronotropic incompetence.  Will review labs and monitor results.  2.  Hypertension: She has been taken off  all antihypertensive medications.  Blood pressures been running soft.  Today's blood pressure is elevated, but home blood pressures are much lower.  She has purchased a new blood pressure machine to be sure that these are accurate.  Will check BMET, magnesium.  She also requests that we check a vitamin D level and a B12 level along with a cholesterol panel.  These will be drawn and also sent to her PCP.  3.  Morbid obesity: She has been on the keto diet for the last 2-1/2 years and has lost a total of 91 pounds.  She has been trying to remain active usually swimming every day but she has stopped doing so due to her recent symptoms.  4.  Hypothyroidism: She is currently on thyroid replacement.  Checking TSH.  5.  History of asthma: She has not had any asthma exacerbations recently.  She is to follow with primary care for any medication adjustments or further testing.   Current medicines are reviewed at length with the patient today.  I have spent 30 minutes dedicated to the care of this patient on the date of this encounter to  include pre-visit review of records, assessment, management and diagnostic testing,with shared decision making.  Labs/ tests ordered today include: TSH, T3-T4, lipids and LFTs, magnesium, vitamin D level, B12 level, hemoglobin A1c.  Phill Myron. West Pugh, ANP, [facility]   03/13/2020 9:23 AM    [facility] [address] Office [phone] Fax [phone]  Notice: This dictation was prepared with Dragon dictation along with smaller phrase technology. Any transcriptional errors that result from this process are unintentional and may not be corrected upon review.

## 2020-03-13 ENCOUNTER — Other Ambulatory Visit: Payer: Self-pay

## 2020-03-13 ENCOUNTER — Ambulatory Visit (INDEPENDENT_AMBULATORY_CARE_PROVIDER_SITE_OTHER): Payer: Medicare Other | Admitting: Adult Health

## 2020-03-13 ENCOUNTER — Encounter: Payer: Self-pay | Admitting: Adult Health

## 2020-03-13 VITALS — BP 158/76 | HR 47 | Ht 61.0 in | Wt 200.6 lb

## 2020-03-13 DIAGNOSIS — I495 Sick sinus syndrome: Secondary | ICD-10-CM | POA: Diagnosis not present

## 2020-03-13 DIAGNOSIS — E785 Hyperlipidemia, unspecified: Secondary | ICD-10-CM

## 2020-03-13 DIAGNOSIS — R001 Bradycardia, unspecified: Secondary | ICD-10-CM

## 2020-03-13 DIAGNOSIS — R42 Dizziness and giddiness: Secondary | ICD-10-CM

## 2020-03-13 DIAGNOSIS — Z79899 Other long term (current) drug therapy: Secondary | ICD-10-CM

## 2020-03-13 DIAGNOSIS — Z131 Encounter for screening for diabetes mellitus: Secondary | ICD-10-CM

## 2020-03-13 DIAGNOSIS — E559 Vitamin D deficiency, unspecified: Secondary | ICD-10-CM

## 2020-03-13 DIAGNOSIS — E039 Hypothyroidism, unspecified: Secondary | ICD-10-CM

## 2020-03-13 NOTE — Patient Instructions (Signed)
Medication Instructions:  Continue current medications  *If you need a refill on your cardiac medications before your next appointment, please call your pharmacy*   Lab Work: TSH, T3, Free T4, Fasting Lipid and Liver, HgB a1C, Magnesium, Vitamin D, Vitamin B12, CBC If you have labs (blood work) drawn today and your tests are completely normal, you will receive your results only by: Marland Kitchen MyChart Message (if you have MyChart) OR . A paper copy in the mail If you have any lab test that is abnormal or we need to change your treatment, we will call you to review the results.   Testing/Procedures: Your physician has recommended that you wear a 1 week Zio monitor. Holter monitors are medical devices that record the heart's electrical activity. Doctors most often use these monitors to diagnose arrhythmias. Arrhythmias are problems with the speed or rhythm of the heartbeat. The monitor is a small, portable device. You can wear one while you do your normal daily activities. This is usually used to diagnose what is causing palpitations/syncope (passing out).  Follow-Up: At [facility], you and your health needs are our priority.  As part of our continuing mission to provide you with exceptional heart care, we have created designated Provider Care Teams.  These Care Teams include your primary Cardiologist (physician) and Advanced Practice Providers (APPs -  Physician Assistants and Nurse Practitioners) who all work together to provide you with the care you need, when you need it.  We recommend signing up for the patient portal called "MyChart".  Sign up information is provided on this After Visit Summary.  MyChart is used to connect with patients for Virtual Visits (Telemedicine).  Patients are able to view lab/test results, encounter notes, upcoming appointments, etc.  Non-urgent messages can be sent to your provider as well.   To learn more about what you can do with MyChart, go to  [email or web address].    Your next appointment:   August 13th @ 9:45 am  The format for your next appointment:   In Person  Provider:   Jory Sims, DNP, ANP   Other Instructions ZIO XT- Long Term Monitor Instructions   Your physician has requested you wear your ZIO patch monitor 7 days.   This is a single patch monitor.  [employer] supplies one patch monitor per enrollment.  Additional stickers are not available.   Please do not apply patch if you will be having a Nuclear Stress Test, Echocardiogram, Cardiac CT, MRI, or Chest Xray during the time frame you would be wearing the monitor. The patch cannot be worn during these tests.  You cannot remove and re-apply the ZIO XT patch monitor.   Your ZIO patch monitor will be sent USPS Priority mail from [facility] directly to your home address. The monitor may also be mailed to a PO BOX if home delivery is not available.   It may take 3-5 days to receive your monitor after you have been enrolled.   Once you have received you monitor, please review enclosed instructions.  Your monitor has already been registered assigning a specific monitor serial # to you.   Applying the monitor   Shave hair from upper left chest.   Hold abrader disc by orange tab.  Rub abrader in 40 strokes over left upper chest as indicated in your monitor instructions.   Clean area with 4 enclosed alcohol pads .  Use all pads to assure are is cleaned thoroughly.  Let dry.   Apply patch as indicated  in monitor instructions.  Patch will be place under collarbone on left side of chest with arrow pointing upward.   Rub patch adhesive wings for 2 minutes.Remove white label marked "1".  Remove white label marked "2".  Rub patch adhesive wings for 2 additional minutes.   While looking in a mirror, press and release button in center of patch.  A small green light will flash 3-4 times .  This will be your only indicator the monitor has been turned on.      Do not shower for the first 24 hours.  You may shower after the first 24 hours.   Press button if you feel a symptom. You will hear a small click.  Record Date, Time and Symptom in the Patient Log Book.   When you are ready to remove patch, follow instructions on last 2 pages of Patient Log Book.  Stick patch monitor onto last page of Patient Log Book.   Place Patient Log Book in [place] box.  Use locking tab on box and tape box closed securely.  The [employer] and [employer] has [employer] on it.  Please place in mailbox as soon as possible.  Your physician should have your test results approximately 7 days after the monitor has been mailed back to [facility].   Call [place] at [phone] if you have questions regarding your ZIO XT patch monitor.  Call them immediately if you see an orange light blinking on your monitor.   If your monitor falls off in less than 4 days contact our Monitor department at [phone].  If your monitor becomes loose or falls off after 4 days call [employer] at [phone] for suggestions on securing your monitor.

## 2020-03-14 LAB — HEPATIC FUNCTION PANEL
ALT: 12 IU/L (ref 0–32)
AST: 18 IU/L (ref 0–40)
Albumin: 4.3 g/dL (ref 3.8–4.8)
Alkaline Phosphatase: 72 IU/L (ref 48–121)
Bilirubin Total: 0.3 mg/dL (ref 0.0–1.2)
Bilirubin, Direct: 0.08 mg/dL (ref 0.00–0.40)
Total Protein: 6.6 g/dL (ref 6.0–8.5)

## 2020-03-14 LAB — VITAMIN B12: Vitamin B-12: 603 pg/mL (ref 232–1245)

## 2020-03-14 LAB — HEMOGLOBIN A1C
Est. average glucose Bld gHb Est-mCnc: 108 mg/dL
Hgb A1c MFr Bld: 5.4 % (ref 4.8–5.6)

## 2020-03-14 LAB — CBC
Hematocrit: 38.4 % (ref 34.0–46.6)
Hemoglobin: 12.9 g/dL (ref 11.1–15.9)
MCH: 29.1 pg (ref 26.6–33.0)
MCHC: 33.6 g/dL (ref 31.5–35.7)
MCV: 87 fL (ref 79–97)
Platelets: 236 10*3/uL (ref 150–450)
RBC: 4.44 x10E6/uL (ref 3.77–5.28)
RDW: 13.2 % (ref 11.7–15.4)
WBC: 5.9 10*3/uL (ref 3.4–10.8)

## 2020-03-14 LAB — LIPID PANEL
Chol/HDL Ratio: 4.7 ratio — ABNORMAL HIGH (ref 0.0–4.4)
Cholesterol, Total: 317 mg/dL — ABNORMAL HIGH (ref 100–199)
HDL: 68 mg/dL (ref >39)
LDL Chol Calc (NIH): 231 mg/dL — ABNORMAL HIGH (ref 0–99)
Triglycerides: 105 mg/dL (ref 0–149)
VLDL Cholesterol Cal: 18 mg/dL (ref 5–40)

## 2020-03-14 LAB — T4, FREE: Free T4: 1.58 ng/dL (ref 0.82–1.77)

## 2020-03-14 LAB — TSH: TSH: 1.83 u[IU]/mL (ref 0.450–4.500)

## 2020-03-14 LAB — T3: T3, Total: 73 ng/dL (ref 71–180)

## 2020-03-14 LAB — MAGNESIUM: Magnesium: 2.2 mg/dL (ref 1.6–2.3)

## 2020-03-14 LAB — VITAMIN D 25 HYDROXY (VIT D DEFICIENCY, FRACTURES): Vit D, 25-Hydroxy: 31.7 ng/mL (ref 30.0–100.0)

## 2020-03-16 ENCOUNTER — Ambulatory Visit (INDEPENDENT_AMBULATORY_CARE_PROVIDER_SITE_OTHER): Payer: Medicare Other

## 2020-03-16 DIAGNOSIS — R42 Dizziness and giddiness: Secondary | ICD-10-CM

## 2020-03-16 DIAGNOSIS — R001 Bradycardia, unspecified: Secondary | ICD-10-CM

## 2020-03-21 ENCOUNTER — Telehealth: Payer: Self-pay | Admitting: Cardiology

## 2020-03-21 NOTE — Telephone Encounter (Signed)
Called and spoke with pt, she reports that while she was placing the monitor she did not read the directions all the way and missed where it said to press the button when having any symptoms. She reports she did not have any symptoms yesterday and that there have only been a few instances where she would have pressed the button.  Pt states she placed the monitor last Friday so she has all of Thursday and Friday left for the monitor. She would like to know if the monitor needs to be extended a few days to get more readings since it was not being used correctly for the first few days.  Notified I would send this message to Jory Sims for her advice and that we would get back with her as soon as we could. Pt verbalized understanding with no other questions at this time.

## 2020-03-21 NOTE — Telephone Encounter (Signed)
Called and spoke with pt, notified of Amy Blackwell's recommendations. Notified that I had been intouch with one of the monitor techs and they will contact our monitor rep to extend the monitor another 7 days. Pt verbalized understanding. Pt inquiring if she can swim and exercise with the monitor on. Notified swimming should be fine as long as the monitor is not submerged and that other exercise is fine as well. Notified I would send the message to one of the monitor techs regarding the swimming question and if there were any other suggestions with swimming we would let her know. Pt verbalized understanding with no other questions at this time.

## 2020-03-21 NOTE — Telephone Encounter (Signed)
She may keep the monitor an additional week to be able to get a longer duration of heart rhythm.   Curt Bears

## 2020-03-21 NOTE — Telephone Encounter (Signed)
Patient is calling to make Amy Blackwell aware that she has been wearing her monitor incorrectly. She is requesting a call back to ensure that she is now wearing monitor correctly. She states she would also like to know whether she needs to have it on for a few additional days. Please call to discuss.

## 2020-04-13 ENCOUNTER — Ambulatory Visit: Payer: Medicare Other | Admitting: Adult Health

## 2020-04-27 ENCOUNTER — Telehealth: Payer: Medicare Other | Admitting: Adult Health

## 2020-04-30 ENCOUNTER — Telehealth: Payer: Self-pay | Admitting: Cardiology

## 2020-04-30 NOTE — Telephone Encounter (Signed)
Patient states, Per Dr. Evette Georges nurse, to call and ask for triage. Unsure of what for - patient would not say. Please advise.

## 2020-04-30 NOTE — Telephone Encounter (Signed)
Called patient- she was advised that she had an appointment on Friday with NP- she mentions that her HR has been in the low 50's now, and just feeling like she has some flutters when it goes slow- she states it is hard to explain.  I did advise patient to monitor her BP and her HR over the next few days with a log of symptoms for her visit on Friday.  Patient verbalized understanding. Will route to NP to make aware for her visit on Friday.

## 2020-05-02 NOTE — Telephone Encounter (Signed)
Thank you Almyra Free!  Curt Bears

## 2020-05-03 NOTE — Progress Notes (Signed)
Virtual Visit via Telephone Note   This visit type was conducted due to national recommendations for restrictions regarding the COVID-19 Pandemic (e.g. social distancing) in an effort to limit this patient's exposure and mitigate transmission in our community.  Due to her co-morbid illnesses, this patient is at least at moderate risk for complications without adequate follow up.  This format is felt to be most appropriate for this patient at this time.  The patient did not have access to video technology/had technical difficulties with video requiring transitioning to audio format only (telephone).  All issues noted in this document were discussed and addressed.  No physical exam could be performed with this format.  Please refer to the patient's chart for her  consent to telehealth for [facility].   Date:  05/04/2020   ID:  Amy Blackwell, DOB 07/28/1951, MRN [id number]  Patient Location: Home Provider Location: Office/Clinic  PCP:  Amy Rasmussen, MD  Cardiologist:  Amy Breeding, MD  Electrophysiologist:  None   Evaluation Performed:  Follow-Up Visit  Chief Complaint:  Bradycardia   History of Present Illness:     Amy Blackwell is a 69 y.o. female who presents for  ongoing assessment and management of chest discomfort.  She has a history of a pericardial effusion and some coronary calcification noted on CT.  She also has chronic back pain and discomfort under her breasts feeling like a band tightening.  This happens sporadically  Amy Blackwell had a nuclear medicine stress test on 09/29/2018 which was negative and found to be low risk.  No further invasive testing was planned.  She also had an echocardiogram on 09/21/2018 which revealed normal LV systolic function of 44% to 60%.  She did have mild left ventricular relaxation with Grade 1 diastolic dysfunction.  The patient is on a strict weight loss diet, and when seen in July 2021 she had lost 91 pounds on a keto diet and was also  swimming every day.  When she was seen last she had complaints of noticing her heart rate was significantly lower along with her blood pressure.  On review of her blood pressure record at that time her blood pressure had been running 88/69 to highest of 120/82.  She also describes a ""band like pain" with occasional chest tightness not associated with activity.  I checked a TSH and placed a ZIO cardiac monitor on four 1 week.  She has a history of hypothyroidism and has not had recent labs completed by her primary care provider.  She had already been taken off of all antihypertensive medications.  At the time of the office visit her blood pressure was elevated.  She was uncertain of the accuracy of her blood pressure machine at home.  The patient also requested that we check a vitamin D level and a B12 level along with a cholesterol panel.  All of her labs were normal.   Cardiac monitor with result date on 04/12/2020 revealed normal sinus rhythm, occasional short runs of SVT, longest SVT run was 12 beats, rare ventricular ectopy, no sustained arrhythmias.  Average heart rate was 65 bpm, lowest was 44 bpm.  She called our office on 04/30/2020 stating that her heart rate had been as low as the 50s and she had been feeling like she was having some flutters in her chest.  She was advised to keep up with her heart rate and blood pressure until being seen today.  Heart rate is better, blood pressure  has stabilized on today's appointment.  She is very diligent concerning her blood pressure readings and watching her HR. She is having myalgias with statin therapy. She has been on Simvastatin, atorvastatin, and rosuvastatin. She is interested in PCSK9 9 inhibitor.therapy.   Past Medical History:  Diagnosis Date  . Arthritis   . Bursitis   . Hypercholesterolemia   . Osteoporosis   . Thyroid disease     Past Surgical History:  Procedure Laterality Date  . EYE SURGERY    . KNEE SURGERY       Current  Outpatient Medications  Medication Sig Dispense Refill  . albuterol (VENTOLIN HFA) 108 (90 Base) MCG/ACT inhaler Inhale into the lungs as needed for wheezing or shortness of breath.    . Fluticasone-Salmeterol (ADVAIR) 500-50 MCG/DOSE AEPB Inhale 1 puff into the lungs as needed.    Marland Kitchen levothyroxine (SYNTHROID) 88 MCG tablet Take 88 mcg by mouth daily before breakfast.     No current facility-administered medications for this visit.    Allergies:   Codeine and Shellfish allergy    Social History:  The patient  reports that she has never smoked. She has never used smokeless tobacco. She reports that she does not drink alcohol and does not use drugs.   Family History:  The patient's family history includes CAD (age of onset: 87) in her brother; Dementia in her mother; Peripheral vascular disease in her father; Prostate cancer in her father.    ROS: All other systems are reviewed and negative. Unless otherwise mentioned in H&P    PHYSICAL EXAM: (Limited due to telephone visit) VS:  There were no vitals taken for this visit. , BMI There is no height or weight on file to calculate BMI. GEN: Well nourished, well developed, in no acute distress Neuro:  Strength and sensation are intact Psych: euthymic mood, full affect   EKG:  EKG is not ordered today.    Recent Labs: 03/13/2020: ALT 12; Hemoglobin 12.9; Magnesium 2.2; Platelets 236; TSH 1.830    Lipid Panel    Component Value Date/Time   CHOL 317 (H) 03/13/2020 1003   TRIG 105 03/13/2020 1003   HDL 68 03/13/2020 1003   CHOLHDL 4.7 (H) 03/13/2020 1003   LDLCALC 231 (H) 03/13/2020 1003      Wt Readings from Last 3 Encounters:  03/13/20 200 lb 9.6 oz (91 kg)  09/29/18 206 lb (93.4 kg)  09/16/18 206 lb (93.4 kg)      The patient does not have symptoms concerning for COVID-19 infection (fever, chills, cough, or new shortness of breath).    Past Medical History:  Diagnosis Date  . Arthritis   . Bursitis   .  Hypercholesterolemia   . Osteoporosis   . Thyroid disease    Past Surgical History:  Procedure Laterality Date  . EYE SURGERY    . KNEE SURGERY       Current Meds  Medication Sig  . albuterol (VENTOLIN HFA) 108 (90 Base) MCG/ACT inhaler Inhale into the lungs as needed for wheezing or shortness of breath.  Marland Kitchen aspirin EC 81 MG tablet Take 81 mg by mouth daily. Swallow whole.  Marland Kitchen Fluticasone-Salmeterol (ADVAIR) 500-50 MCG/DOSE AEPB Inhale 1 puff into the lungs as needed.  Marland Kitchen levothyroxine (SYNTHROID) 88 MCG tablet Take 88 mcg by mouth daily before breakfast.  . rosuvastatin (CRESTOR) 20 MG tablet Take 20 mg by mouth daily.  . Vitamin D, Ergocalciferol, (DRISDOL) 1.25 MG (50000 UNIT) CAPS capsule Take 50,000 Units by mouth  daily.     Allergies:   Codeine and Shellfish allergy   Social History   Tobacco Use  . Smoking status: Never Smoker  . Smokeless tobacco: Never Used  Substance Use Topics  . Alcohol use: No  . Drug use: No     Family Hx: The patient's family history includes CAD (age of onset: 57) in her brother; Dementia in her mother; Peripheral vascular disease in her father; Prostate cancer in her father.  ROS:   Please see the history of present illness.   All other systems reviewed and are negative.   Prior CV studies:   The following studies were reviewed today:  Echocardiogram 09/21/2018 Left ventricle: The cavity size was normal. There was mild focal  basal hypertrophy of the septum. Systolic function was normal.  The estimated ejection fraction was in the range of 55% to 60%.  Wall motion was normal; there were no regional wall motion  abnormalities. Doppler parameters are consistent with abnormal  left ventricular relaxation (grade 1 diastolic dysfunction).  Doppler parameters are consistent with indeterminate ventricular  filling pressure.  - Aortic valve: Transvalvular velocity was within the normal range.  There was no stenosis. There was  no regurgitation.  - Mitral valve: Transvalvular velocity was within the normal range.  There was no evidence for stenosis. There was trivial  regurgitation.  - Left atrium: The atrium was mildly dilated.  - Right ventricle: The cavity size was normal. Wall thickness was  normal. Systolic function was normal.  - Atrial septum: No defect or patent foramen ovale was identified  by color flow Doppler.  - Tricuspid valve: There was mild regurgitation.  - Pulmonary arteries: Systolic pressure was within the normal  range. PA peak pressure: 23 mm Hg (S).  - Pericardium, extracardiac: A trivial pericardial effusion was  identified.    NM Stress Test 09/29/2018     The left ventricular ejection fraction is normal (55-65%).  Nuclear stress EF: 64%. No wall motion abnormalities.  There was no ST segment deviation noted during stress.  This is a low risk study. Mild apical breast attenuation artifact noted. No ischemia or infarct identified.    Labs/Other Tests and Data Reviewed:    EKG:  No ECG reviewed.  Recent Labs: 03/13/2020: ALT 12; Hemoglobin 12.9; Magnesium 2.2; Platelets 236; TSH 1.830   Recent Lipid Panel Lab Results  Component Value Date/Time   CHOL 317 (H) 03/13/2020 10:03 AM   TRIG 105 03/13/2020 10:03 AM   HDL 68 03/13/2020 10:03 AM   CHOLHDL 4.7 (H) 03/13/2020 10:03 AM   LDLCALC 231 (H) 03/13/2020 10:03 AM    Wt Readings from Last 3 Encounters:  05/04/20 198 lb (89.8 kg)  03/13/20 200 lb 9.6 oz (91 kg)  09/29/18 206 lb (93.4 kg)     Objective:    Vital Signs:  BP 112/62   Pulse 66   Ht 5' 1"  (1.549 m)   Wt 198 lb (89.8 kg)   BMI 37.41 kg/m    VITAL SIGNS:  reviewed GEN:  no acute distress RESPIRATORY:  normal respiratory effort, symmetric expansion NEURO:  alert and oriented x 3, no obvious focal deficit PSYCH:  normal affect  ASSESSMENT & PLAN:    1. Hyperlipidemia:bMost recent lipid panel in 03/2020 revealed TC of 317, LDL 231.  She is on rosuvastatin. Has been on simvastatin and atorvastatin, and states that she has myalgia pain with these medications. I will refer her to Lipid Clinic with Pharm D to  discuss PCSK9 inhibitor therapy.   2. .  Bradycardia: I reviewed her Zio monitor with her. She has not had any low HR per her monitor. She continues to feel some flutters on occasion. She is on steroid inhalers for Asthma. This may be a contributing factor.  3. Hypertension:  BP is well controlled. No changes in her regimen  4. Hyperthyroidism: Followed by PCP.    COVID-19 Education: The signs and symptoms of COVID-19 were discussed with the patient and how to seek care for testing (follow up with PCP or arrange E-visit).  The importance of social distancing was discussed today.  Time:   Today, I have spent 25 minutes with the patient with telehealth technology discussing the above problems.     Medication Adjustments/Labs and Tests Ordered: Current medicines are reviewed at length with the patient today.  Concerns regarding medicines are outlined above.   Tests Ordered: No orders of the defined types were placed in this encounter.   Medication Changes: No orders of the defined types were placed in this encounter.   Disposition:  Follow up 6 months  Signed, Phill Myron. West Pugh, ANP, AACC  05/04/2020 9:45 AM    [facility]

## 2020-05-04 ENCOUNTER — Telehealth (INDEPENDENT_AMBULATORY_CARE_PROVIDER_SITE_OTHER): Payer: Medicare Other | Admitting: Adult Health

## 2020-05-04 ENCOUNTER — Encounter: Payer: Self-pay | Admitting: Adult Health

## 2020-05-04 VITALS — BP 112/62 | HR 66 | Ht 61.0 in | Wt 198.0 lb

## 2020-05-04 DIAGNOSIS — E785 Hyperlipidemia, unspecified: Secondary | ICD-10-CM | POA: Diagnosis not present

## 2020-05-04 DIAGNOSIS — R001 Bradycardia, unspecified: Secondary | ICD-10-CM | POA: Diagnosis not present

## 2020-05-04 DIAGNOSIS — I10 Essential (primary) hypertension: Secondary | ICD-10-CM

## 2020-05-04 DIAGNOSIS — Z79899 Other long term (current) drug therapy: Secondary | ICD-10-CM

## 2020-05-04 NOTE — Patient Instructions (Addendum)
Medication Instructions:    Your physician recommends that you continue on your current medications as directed. Please refer to the Current Medication list given to you today.  *If you need a refill on your cardiac medications before your next appointment, please call your pharmacy*   Lab Work: [place]  If you have labs (blood work) drawn today and your tests are completely normal, you will receive your results only by: Marland Kitchen MyChart Message (if you have MyChart) OR . A paper copy in the mail If you have any lab test that is abnormal or we need to change your treatment, we will call you to review the results.   Testing/Procedures: NONE ORDERED  TODAY   Follow-Up: At [facility], you and your health needs are our priority.  As part of our continuing mission to provide you with exceptional heart care, we have created designated Provider Care Teams.  These Care Teams include your primary Cardiologist (physician) and Advanced Practice Providers (APPs -  Physician Assistants and Nurse Practitioners) who all work together to provide you with the care you need, when you need it.  We recommend signing up for the patient portal called "MyChart".  Sign up information is provided on this After Visit Summary.  MyChart is used to connect with patients for Virtual Visits (Telemedicine).  Patients are able to view lab/test results, encounter notes, upcoming appointments, etc.  Non-urgent messages can be sent to your provider as well.   To learn more about what you can do with MyChart, go to [email or web address].    Your next appointment:   6 month(s)  The format for your next appointment:   In Person  Provider:   You may see Minus Breeding, MD or one of the following Advanced Practice Providers on your designated Care Team:    Rosaria Ferries, PA-C  Jory Sims, DNP, ANP  Cadence Kathlen Mody, PA-C    Other Instructions   You have been referred to lipid clinic/Pharm D for  Repatha Therapy      (SOME ONE CONTACT YOU FROM [place] )

## 2020-09-05 DIAGNOSIS — M797 Fibromyalgia: Secondary | ICD-10-CM | POA: Diagnosis not present

## 2020-09-05 DIAGNOSIS — I10 Essential (primary) hypertension: Secondary | ICD-10-CM | POA: Diagnosis not present

## 2020-09-05 DIAGNOSIS — J4540 Moderate persistent asthma, uncomplicated: Secondary | ICD-10-CM | POA: Diagnosis not present

## 2020-09-05 DIAGNOSIS — Z6839 Body mass index (BMI) 39.0-39.9, adult: Secondary | ICD-10-CM | POA: Diagnosis not present

## 2020-09-06 DIAGNOSIS — L910 Hypertrophic scar: Secondary | ICD-10-CM | POA: Diagnosis not present

## 2020-09-06 DIAGNOSIS — L570 Actinic keratosis: Secondary | ICD-10-CM | POA: Diagnosis not present

## 2020-09-06 DIAGNOSIS — L708 Other acne: Secondary | ICD-10-CM | POA: Diagnosis not present

## 2020-09-06 DIAGNOSIS — L814 Other melanin hyperpigmentation: Secondary | ICD-10-CM | POA: Diagnosis not present

## 2020-09-06 DIAGNOSIS — L821 Other seborrheic keratosis: Secondary | ICD-10-CM | POA: Diagnosis not present

## 2020-09-06 DIAGNOSIS — D225 Melanocytic nevi of trunk: Secondary | ICD-10-CM | POA: Diagnosis not present

## 2020-09-06 DIAGNOSIS — D485 Neoplasm of uncertain behavior of skin: Secondary | ICD-10-CM | POA: Diagnosis not present

## 2020-09-06 DIAGNOSIS — L820 Inflamed seborrheic keratosis: Secondary | ICD-10-CM | POA: Diagnosis not present

## 2020-09-06 DIAGNOSIS — D1801 Hemangioma of skin and subcutaneous tissue: Secondary | ICD-10-CM | POA: Diagnosis not present

## 2020-09-13 DIAGNOSIS — Z0001 Encounter for general adult medical examination with abnormal findings: Secondary | ICD-10-CM | POA: Diagnosis not present

## 2020-09-13 DIAGNOSIS — I10 Essential (primary) hypertension: Secondary | ICD-10-CM | POA: Diagnosis not present

## 2020-09-13 DIAGNOSIS — I700 Atherosclerosis of aorta: Secondary | ICD-10-CM | POA: Diagnosis not present

## 2020-09-13 DIAGNOSIS — E039 Hypothyroidism, unspecified: Secondary | ICD-10-CM | POA: Diagnosis not present

## 2020-09-13 DIAGNOSIS — E559 Vitamin D deficiency, unspecified: Secondary | ICD-10-CM | POA: Diagnosis not present

## 2020-09-13 DIAGNOSIS — M797 Fibromyalgia: Secondary | ICD-10-CM | POA: Diagnosis not present

## 2020-09-13 DIAGNOSIS — J4540 Moderate persistent asthma, uncomplicated: Secondary | ICD-10-CM | POA: Diagnosis not present

## 2020-09-13 DIAGNOSIS — E1165 Type 2 diabetes mellitus with hyperglycemia: Secondary | ICD-10-CM | POA: Diagnosis not present

## 2020-09-13 DIAGNOSIS — E785 Hyperlipidemia, unspecified: Secondary | ICD-10-CM | POA: Diagnosis not present

## 2020-09-13 DIAGNOSIS — Z6839 Body mass index (BMI) 39.0-39.9, adult: Secondary | ICD-10-CM | POA: Diagnosis not present

## 2020-09-13 DIAGNOSIS — M47814 Spondylosis without myelopathy or radiculopathy, thoracic region: Secondary | ICD-10-CM | POA: Diagnosis not present

## 2020-09-13 DIAGNOSIS — N390 Urinary tract infection, site not specified: Secondary | ICD-10-CM | POA: Diagnosis not present

## 2020-09-19 ENCOUNTER — Encounter (INDEPENDENT_AMBULATORY_CARE_PROVIDER_SITE_OTHER): Payer: Self-pay

## 2020-09-20 ENCOUNTER — Encounter (INDEPENDENT_AMBULATORY_CARE_PROVIDER_SITE_OTHER): Payer: Self-pay | Admitting: Family Medicine

## 2020-09-20 ENCOUNTER — Ambulatory Visit (INDEPENDENT_AMBULATORY_CARE_PROVIDER_SITE_OTHER): Payer: PPO | Admitting: Family Medicine

## 2020-09-20 ENCOUNTER — Other Ambulatory Visit: Payer: Self-pay

## 2020-09-20 VITALS — BP 116/70 | HR 51 | Temp 97.5°F | Ht 62.0 in | Wt 208.0 lb

## 2020-09-20 DIAGNOSIS — E782 Mixed hyperlipidemia: Secondary | ICD-10-CM | POA: Diagnosis not present

## 2020-09-20 DIAGNOSIS — Z0289 Encounter for other administrative examinations: Secondary | ICD-10-CM

## 2020-09-20 DIAGNOSIS — Z6838 Body mass index (BMI) 38.0-38.9, adult: Secondary | ICD-10-CM

## 2020-09-20 DIAGNOSIS — R0602 Shortness of breath: Secondary | ICD-10-CM | POA: Diagnosis not present

## 2020-09-20 DIAGNOSIS — E559 Vitamin D deficiency, unspecified: Secondary | ICD-10-CM | POA: Diagnosis not present

## 2020-09-20 DIAGNOSIS — R632 Polyphagia: Secondary | ICD-10-CM | POA: Diagnosis not present

## 2020-09-20 DIAGNOSIS — Z1331 Encounter for screening for depression: Secondary | ICD-10-CM | POA: Diagnosis not present

## 2020-09-20 DIAGNOSIS — K9041 Non-celiac gluten sensitivity: Secondary | ICD-10-CM

## 2020-09-20 DIAGNOSIS — E034 Atrophy of thyroid (acquired): Secondary | ICD-10-CM | POA: Diagnosis not present

## 2020-09-20 DIAGNOSIS — R5383 Other fatigue: Secondary | ICD-10-CM

## 2020-09-22 LAB — COMPREHENSIVE METABOLIC PANEL
ALT: 15 IU/L (ref 0–32)
AST: 24 IU/L (ref 0–40)
Albumin/Globulin Ratio: 1.7 (ref 1.2–2.2)
Albumin: 4.3 g/dL (ref 3.7–4.7)
Alkaline Phosphatase: 76 IU/L (ref 44–121)
BUN/Creatinine Ratio: 26 (ref 12–28)
BUN: 23 mg/dL (ref 8–27)
Bilirubin Total: <0.2 mg/dL (ref 0.0–1.2)
CO2: 28 mmol/L (ref 20–29)
Calcium: 9.7 mg/dL (ref 8.7–10.3)
Chloride: 103 mmol/L (ref 96–106)
Creatinine, Ser: 0.9 mg/dL (ref 0.57–1.00)
GFR calc Af Amer: 74 mL/min/{1.73_m2} (ref >59)
GFR calc non Af Amer: 65 mL/min/{1.73_m2} (ref >59)
Globulin, Total: 2.6 g/dL (ref 1.5–4.5)
Glucose: 87 mg/dL (ref 65–99)
Potassium: 4.5 mmol/L (ref 3.5–5.2)
Sodium: 141 mmol/L (ref 134–144)
Total Protein: 6.9 g/dL (ref 6.0–8.5)

## 2020-09-22 LAB — CBC WITH DIFFERENTIAL/PLATELET
Basophils Absolute: 0.1 10*3/uL (ref 0.0–0.2)
Basos: 1 %
EOS (ABSOLUTE): 0.1 10*3/uL (ref 0.0–0.4)
Eos: 1 %
Hematocrit: 39.4 % (ref 34.0–46.6)
Hemoglobin: 12.9 g/dL (ref 11.1–15.9)
Immature Grans (Abs): 0 10*3/uL (ref 0.0–0.1)
Immature Granulocytes: 0 %
Lymphocytes Absolute: 1.9 10*3/uL (ref 0.7–3.1)
Lymphs: 29 %
MCH: 28.6 pg (ref 26.6–33.0)
MCHC: 32.7 g/dL (ref 31.5–35.7)
MCV: 87 fL (ref 79–97)
Monocytes Absolute: 0.5 10*3/uL (ref 0.1–0.9)
Monocytes: 7 %
Neutrophils Absolute: 4.1 10*3/uL (ref 1.4–7.0)
Neutrophils: 62 %
Platelets: 302 10*3/uL (ref 150–450)
RBC: 4.51 x10E6/uL (ref 3.77–5.28)
RDW: 13.7 % (ref 11.7–15.4)
WBC: 6.6 10*3/uL (ref 3.4–10.8)

## 2020-09-22 LAB — LIPID PANEL WITH LDL/HDL RATIO
Cholesterol, Total: 227 mg/dL — ABNORMAL HIGH (ref 100–199)
HDL: 71 mg/dL (ref >39)
LDL Chol Calc (NIH): 139 mg/dL — ABNORMAL HIGH (ref 0–99)
LDL/HDL Ratio: 2 ratio (ref 0.0–3.2)
Triglycerides: 100 mg/dL (ref 0–149)
VLDL Cholesterol Cal: 17 mg/dL (ref 5–40)

## 2020-09-22 LAB — GLIADIN IGA+TTG IGA
Antigliadin Abs, IgA: 3 units (ref 0–19)
Transglutaminase IgA: <2 U/mL (ref 0–3)

## 2020-09-22 LAB — T4: T4, Total: 9.7 ug/dL (ref 4.5–12.0)

## 2020-09-22 LAB — T3: T3, Total: 81 ng/dL (ref 71–180)

## 2020-09-22 LAB — TSH: TSH: 3.25 u[IU]/mL (ref 0.450–4.500)

## 2020-09-22 LAB — VITAMIN D 25 HYDROXY (VIT D DEFICIENCY, FRACTURES): Vit D, 25-Hydroxy: 23.6 ng/mL — ABNORMAL LOW (ref 30.0–100.0)

## 2020-09-22 LAB — INSULIN, RANDOM: INSULIN: 6 u[IU]/mL (ref 2.6–24.9)

## 2020-09-25 NOTE — Progress Notes (Signed)
Chief Complaint:   OBESITY Amy Blackwell (MR# [id number]) is a 70 y.o. female who presents for evaluation and treatment of obesity and related comorbidities. Current BMI is Body mass index is 38.04 kg/m. Amy Blackwell has been struggling with her weight for many years and has been unsuccessful in either losing weight, maintaining weight loss, or reaching her healthy weight goal.  Amy Blackwell is currently in the action stage of change and ready to dedicate time achieving and maintaining a healthier weight. Amy Blackwell is interested in becoming our patient and working on intensive lifestyle modifications including (but not limited to) diet and exercise for weight loss.  Amy Blackwell's habits were reviewed today and are as follows: Her family eats meals together, her desired weight loss is 58 lbs, she started gaining weight 10 years ago, her heaviest weight ever was 271 lbs pounds, she has significant food cravings issues, she snacks frequently in the evenings, she skips meals frequently, she frequently makes poor food choices, she has problems with excessive hunger, she frequently eats larger portions than normal and she struggles with emotional eating.  Depression Screen Amy Blackwell's Food and Mood (modified PHQ-9) score was 13.  Depression screen Amy Blackwell 2/9 09/20/2020  Decreased Interest 2  Down, Depressed, Hopeless 1  PHQ - 2 Score 3  Altered sleeping 2  Tired, decreased energy 2  Change in appetite 2  Feeling bad or failure about yourself  2  Trouble concentrating 1  Moving slowly or fidgety/restless 1  Suicidal thoughts 0  PHQ-9 Score 13   Subjective:   1. Other fatigue Amy Blackwell admits to daytime somnolence and admits to waking up still tired. Patent has a history of symptoms of daytime fatigue. Amy Blackwell generally gets 5 or 6 hours of sleep per night, and states that she has nightime awakenings. Snoring is present. Apneic episodes are not present. Epworth Sleepiness Score is 8.  2. Shortness of breath on  exertion Amy Blackwell notes increasing shortness of breath with exercising and seems to be worsening over time with weight gain. She notes getting out of breath sooner with activity than she used to. This has not gotten worse recently. Amy Blackwell denies shortness of breath at rest or orthopnea.  3. Mixed hyperlipidemia Amy Blackwell has a history of hyperlipidemia. She is trying to work on diet, and she has done low carbohydrate and keto in the past.  4. Vitamin D deficiency Amy Blackwell is on Vit D, and she still notes fatigue.  5. Hypothyroidism due to acquired atrophy of thyroid Amy Blackwell is on levothyroxine, and she notes fatigue. She is due for labs.  6. NCGS (non-celiac gluten sensitivity) Amy Blackwell suspects that she may have celiac disease, as she feels better when she decreases simple carbohydrates. She never had a workup for this.  7. Polyphagia Amy Blackwell has a history of some excessive hunger. Her last recent A1c was within normal limits, but fasting insulin was not checked.  Assessment/Plan:   1. Other fatigue Amy Blackwell does feel that her weight is causing her energy to be lower than it should be. Fatigue may be related to obesity, depression or many other causes. Labs will be ordered, and in the meanwhile, Amy Blackwell will focus on self care including making healthy food choices, increasing physical activity and focusing on stress reduction.  - CBC with Differential/Platelet - Comprehensive metabolic panel - EKG [id number]  2. Shortness of breath on exertion Amy Blackwell does feel that she gets out of breath more easily that she used to when she exercises. Amy Blackwell's shortness of breath  appears to be obesity related and exercise induced. She has agreed to work on weight loss and gradually increase exercise to treat her exercise induced shortness of breath. Will continue to monitor closely.  3. Mixed hyperlipidemia Cardiovascular risk and specific lipid/LDL goals reviewed. We discussed several lifestyle modifications today. We will  check labs today. Amy Blackwell will start her Category 2 plan, and will continue to work on exercise and weight loss efforts. Orders and follow up as documented in patient record.   - Lipid Panel With LDL/HDL Ratio  4. Vitamin D deficiency Low Vitamin D level contributes to fatigue and are associated with obesity, breast, and colon cancer. We will check labs today. Amy Blackwell will follow-up for routine testing of Vitamin D, at least 2-3 times per year to avoid over-replacement.  - VITAMIN D 25 Hydroxy (Vit-D Deficiency, Fractures)  5. Hypothyroidism due to acquired atrophy of thyroid Patient with long-standing hypothyroidism, on levothyroxine therapy. We will check labs today, and Amy Blackwell will follow up as directed. Orders and follow up as documented in patient record.  - T3 - T4 - TSH  6. NCGS (non-celiac gluten sensitivity) We will check labs today. Amy Blackwell will follow up as directed.  - Gliadin IgA+tTG IgA  7. Polyphagia Intensive lifestyle modifications are the first line treatment for this issue. We discussed several lifestyle modifications today. We will check labs today. Amy Blackwell will continue to work on diet, exercise and weight loss efforts. Orders and follow up as documented in patient record.  - Comprehensive metabolic panel - Insulin, random  8. Screening for depression Amy Blackwell had a positive depression screening. Depression is commonly associated with obesity and often results in emotional eating behaviors. We will monitor this closely and work on CBT to help improve the non-hunger eating patterns. Referral to Psychology may be required if no improvement is seen as she continues in our clinic.  9. Class 2 severe obesity with serious comorbidity and body mass index (BMI) of 38.0 to 38.9 in adult, unspecified obesity type Amy Blackwell) Amy Blackwell is currently in the action stage of change and her goal is to continue with weight loss efforts. I recommend Amy Blackwell begin the structured treatment plan as  follows:  She has agreed to the Category 2 Plan.  Exercise goals: No exercise has been prescribed for now, while we concentrate on nutritional changes.  Behavioral modification strategies: meal planning and cooking strategies.  She was informed of the importance of frequent follow-up visits to maximize her success with intensive lifestyle modifications for her multiple health conditions. She was informed we would discuss her lab results at her next visit unless there is a critical issue that needs to be addressed sooner. Davie agreed to keep her next visit at the agreed upon time to discuss these results.  Objective:   Blood pressure 116/70, pulse (!) 51, temperature (!) 97.5 F (36.4 C), height 5' 2"  (1.575 m), weight 208 lb (94.3 kg), SpO2 97 %. Body mass index is 38.04 kg/m.  EKG: Normal sinus rhythm, bradycardia rate 55 BPM.  Indirect Calorimeter completed today shows a VO2 of 221 and a REE of 1536.  Her calculated basal metabolic rate is 8127 thus her basal metabolic rate is better than expected.  General: Cooperative, alert, well developed, in no acute distress. HEENT: Conjunctivae and lids unremarkable. Cardiovascular: Regular rhythm.  Lungs: Normal work of breathing. Neurologic: No focal deficits.   Lab Results  Component Value Date   CREATININE 0.90 09/20/2020   BUN 23 09/20/2020   NA 141  09/20/2020   K 4.5 09/20/2020   CL 103 09/20/2020   CO2 28 09/20/2020   Lab Results  Component Value Date   ALT 15 09/20/2020   AST 24 09/20/2020   ALKPHOS 76 09/20/2020   BILITOT <0.2 09/20/2020   Lab Results  Component Value Date   HGBA1C 5.4 03/13/2020   Lab Results  Component Value Date   INSULIN 6.0 09/20/2020   Lab Results  Component Value Date   TSH 3.250 09/20/2020   Lab Results  Component Value Date   CHOL 227 (H) 09/20/2020   HDL 71 09/20/2020   LDLCALC 139 (H) 09/20/2020   TRIG 100 09/20/2020   CHOLHDL 4.7 (H) 03/13/2020   Lab Results  Component  Value Date   WBC 6.6 09/20/2020   HGB 12.9 09/20/2020   HCT 39.4 09/20/2020   MCV 87 09/20/2020   PLT 302 09/20/2020   No results found for: IRON, TIBC, FERRITIN Obesity Behavioral Intervention:   Approximately 15 minutes were spent on the discussion below.  ASK: We discussed the diagnosis of obesity with Amy Blackwell today and Jamina agreed to give [place] permission to discuss obesity behavioral modification therapy today.  ASSESS: Holiday has the diagnosis of obesity and her BMI today is 38.03. Mariann is in the action stage of change.   ADVISE: Sheika was educated on the multiple health risks of obesity as well as the benefit of weight loss to improve her health. She was advised of the need for long term treatment and the importance of lifestyle modifications to improve her current health and to decrease her risk of future health problems.  AGREE: Multiple dietary modification options and treatment options were discussed and Katiya agreed to follow the recommendations documented in the above note.  ARRANGE: Keziyah was educated on the importance of frequent visits to treat obesity as outlined per CMS and USPSTF guidelines and agreed to schedule her next follow up appointment today.  Attestation Statements:   Reviewed by clinician on day of visit: allergies, medications, problem list, medical history, surgical history, family history, social history, and previous encounter notes.   I, Trixie Dredge, am acting as transcriptionist for Dennard Nip, MD.  I have reviewed the above documentation for accuracy and completeness, and I agree with the above. - Dennard Nip, MD

## 2020-10-04 ENCOUNTER — Encounter (INDEPENDENT_AMBULATORY_CARE_PROVIDER_SITE_OTHER): Payer: Self-pay | Admitting: Family Medicine

## 2020-10-04 ENCOUNTER — Other Ambulatory Visit: Payer: Self-pay

## 2020-10-04 ENCOUNTER — Ambulatory Visit (INDEPENDENT_AMBULATORY_CARE_PROVIDER_SITE_OTHER): Payer: PPO | Admitting: Family Medicine

## 2020-10-04 VITALS — BP 141/81 | HR 57 | Temp 97.7°F | Ht 62.0 in | Wt 205.0 lb

## 2020-10-04 DIAGNOSIS — E7849 Other hyperlipidemia: Secondary | ICD-10-CM

## 2020-10-04 DIAGNOSIS — E7800 Pure hypercholesterolemia, unspecified: Secondary | ICD-10-CM

## 2020-10-04 DIAGNOSIS — Z6837 Body mass index (BMI) 37.0-37.9, adult: Secondary | ICD-10-CM

## 2020-10-04 DIAGNOSIS — E559 Vitamin D deficiency, unspecified: Secondary | ICD-10-CM | POA: Diagnosis not present

## 2020-10-04 DIAGNOSIS — E66812 Obesity, class 2: Secondary | ICD-10-CM

## 2020-10-04 DIAGNOSIS — R030 Elevated blood-pressure reading, without diagnosis of hypertension: Secondary | ICD-10-CM | POA: Diagnosis not present

## 2020-10-04 MED ORDER — VITAMIN D (ERGOCALCIFEROL) 1.25 MG (50000 UNIT) PO CAPS: 50000.0000 [IU] | ORAL_CAPSULE | ORAL | 0 refills | Status: DC

## 2020-10-08 NOTE — Progress Notes (Signed)
Chief Complaint:   OBESITY Amy Blackwell is here to discuss her progress with her obesity treatment plan along with follow-up of her obesity related diagnoses. Amy Blackwell is on the Category 2 Plan and states she is following her eating plan approximately 50% of the time. Amy Blackwell states she is doing aquatics for 75 minutes 3-4 times per week.  Today's visit was #: 2 Starting weight: 208 lbs Starting date: 09/20/2020 Today's weight: 205 lbs Today's date: 10/04/2020 Total lbs lost to date: 3 Total lbs lost since last in-office visit: 3  Interim History: Amy Blackwell has done well with weight loss but she didn't like  The Category 2 plan and she struggled to follow it closely. She would rather follow a "keto" plan as she feels this is better for her.  Subjective:   1. Vitamin D deficiency Amy Blackwell's Vit D level is very low. She recently started Vit D prescription, but she has not been on it very long. I discussed labs with the patient today.  2. Hyperlipidemia, pure Amy Blackwell is on statin, and she states she has a strong family history of high cholesterol. She is determined to control this with diet, and she wants to come off of all her medications. I discussed labs with the patient today.  3. Elevated blood pressure reading Amy Blackwell's blood pressure is elevated today. She is not on medication currently. She feels this is due to care giver stress and she is avoiding medications.  Assessment/Plan:   1. Vitamin D deficiency Low Vitamin D level contributes to fatigue and are associated with obesity, breast, and colon cancer. We will refill prescription Vitamin D for 1 month, and we will recheck labs in 3 months. Amy Blackwell will follow-up for routine testing of Vitamin D, at least 2-3 times per year to avoid over-replacement.  - Vitamin D, Ergocalciferol, (DRISDOL) 1.25 MG (50000 UNIT) CAPS capsule; Take 1 capsule (50,000 Units total) by mouth every 7 (seven) days.  Dispense: 4 capsule; Refill: 0  2. Hyperlipidemia,  pure Cardiovascular risk and specific lipid/LDL goals reviewed. We discussed several lifestyle modifications today. Amy Blackwell was encouraged to continue with diet, exercise and weight loss efforts. She was encouraged to continue her statin and CoQ10 in the meanwhile. Orders and follow up as documented in patient record.   3. Elevated blood pressure reading Amy Blackwell is to continue diet, exercise, and healthy weight loss to improve blood pressure control. We will continue to monitor for signs of hypotension as she continues her lifestyle modifications.  4. Class 2 severe obesity with serious comorbidity and body mass index (BMI) of 37.0 to 37.9 in adult, unspecified obesity type [facility]) Amy Blackwell is currently in the action stage of change. As such, her goal is to continue with weight loss efforts. She has agreed to keeping a food journal and adhering to recommended goals of 1200-1300 calories and 75+ grams of protein daily.   Amy Blackwell is to change to journaling and is ok to do Keto, but she needs to make sure she meets the macronutrient goal.  Exercise goals: As is.  Behavioral modification strategies: increasing lean protein intake.  Amy Blackwell has agreed to follow-up with our clinic in 2 weeks with Amy Marble, NP. She was informed of the importance of frequent follow-up visits to maximize her success with intensive lifestyle modifications for her multiple health conditions.   Objective:   Blood pressure (!) 141/81, pulse (!) 57, temperature 97.7 F (36.5 C), height 5' 2"  (1.575 m), weight 205 lb (93 kg), SpO2 98 %.  Body mass index is 37.49 kg/m.  General: Cooperative, alert, well developed, in no acute distress. HEENT: Conjunctivae and lids unremarkable. Cardiovascular: Regular rhythm.  Lungs: Normal work of breathing. Neurologic: No focal deficits.   Lab Results  Component Value Date   CREATININE 0.90 09/20/2020   BUN 23 09/20/2020   NA 141 09/20/2020   K 4.5 09/20/2020   CL 103 09/20/2020   CO2  28 09/20/2020   Lab Results  Component Value Date   ALT 15 09/20/2020   AST 24 09/20/2020   ALKPHOS 76 09/20/2020   BILITOT <0.2 09/20/2020   Lab Results  Component Value Date   HGBA1C 5.4 03/13/2020   Lab Results  Component Value Date   INSULIN 6.0 09/20/2020   Lab Results  Component Value Date   TSH 3.250 09/20/2020   Lab Results  Component Value Date   CHOL 227 (H) 09/20/2020   HDL 71 09/20/2020   LDLCALC 139 (H) 09/20/2020   TRIG 100 09/20/2020   CHOLHDL 4.7 (H) 03/13/2020   Lab Results  Component Value Date   WBC 6.6 09/20/2020   HGB 12.9 09/20/2020   HCT 39.4 09/20/2020   MCV 87 09/20/2020   PLT 302 09/20/2020   No results found for: IRON, TIBC, FERRITIN  Obesity Behavioral Intervention:   Approximately 15 minutes were spent on the discussion below.  ASK: We discussed the diagnosis of obesity with Amy Blackwell today and Amy Blackwell agreed to give [place] permission to discuss obesity behavioral modification therapy today.  ASSESS: Amy Blackwell has the diagnosis of obesity and her BMI today is 37.49. Amy Blackwell is in the action stage of change.   ADVISE: Amy Blackwell was educated on the multiple health risks of obesity as well as the benefit of weight loss to improve her health. She was advised of the need for long term treatment and the importance of lifestyle modifications to improve her current health and to decrease her risk of future health problems.  AGREE: Multiple dietary modification options and treatment options were discussed and Amy Blackwell agreed to follow the recommendations documented in the above note.  ARRANGE: Amy Blackwell was educated on the importance of frequent visits to treat obesity as outlined per CMS and USPSTF guidelines and agreed to schedule her next follow up appointment today.  Attestation Statements:   Reviewed by clinician on day of visit: allergies, medications, problem list, medical history, surgical history, family history, social history, and previous encounter  notes.   I, Trixie Dredge, am acting as transcriptionist for Dennard Nip, MD.  I have reviewed the above documentation for accuracy and completeness, and I agree with the above. -  Dennard Nip, MD

## 2020-10-17 ENCOUNTER — Ambulatory Visit (INDEPENDENT_AMBULATORY_CARE_PROVIDER_SITE_OTHER): Payer: PPO | Admitting: Adult Health

## 2020-10-17 ENCOUNTER — Other Ambulatory Visit: Payer: Self-pay

## 2020-10-17 ENCOUNTER — Encounter (INDEPENDENT_AMBULATORY_CARE_PROVIDER_SITE_OTHER): Payer: Self-pay | Admitting: Adult Health

## 2020-10-17 VITALS — BP 139/62 | HR 63 | Temp 97.6°F | Ht 62.0 in | Wt 202.0 lb

## 2020-10-17 DIAGNOSIS — R030 Elevated blood-pressure reading, without diagnosis of hypertension: Secondary | ICD-10-CM

## 2020-10-17 DIAGNOSIS — Z6837 Body mass index (BMI) 37.0-37.9, adult: Secondary | ICD-10-CM | POA: Diagnosis not present

## 2020-10-17 DIAGNOSIS — E782 Mixed hyperlipidemia: Secondary | ICD-10-CM

## 2020-10-18 NOTE — Progress Notes (Signed)
Chief Complaint:   OBESITY Amy Blackwell is here to discuss her progress with her obesity treatment plan along with follow-up of her obesity related diagnoses. Amy Blackwell is on keeping a food journal and adhering to recommended goals of 1200-1300 calories and 95 g protein and states she is following her eating plan approximately 95% of the time. Amy Blackwell states she is doing aquatics 60-90 minutes 2 times per week.  Today's visit was #: 3 Starting weight: 208 lbs Starting date: 09/20/2020 Today's weight: 202 lbs Today's date: 10/17/2020 Total lbs lost to date: 6 lbs Total lbs lost since last in-office visit: 3 lbs  Interim History: Pt was changed from Category 2 meal plan to journaling at 2nd OV. She is following intermittent fasting plus journaling plan. She will fast/eat 20/4 or 19/5.  Subjective:   1. Elevated blood pressure reading Pt's BP at last OV was 141/81 with a heart rate of 57. BP is 139/62 with a heart rate of 63. She is not on an anti-hypertensive.  BP Readings from Last 3 Encounters:  10/17/20 139/62  10/04/20 (!) 141/81  09/20/20 116/70    2. Mixed hyperlipidemia Pt has a family history of HLD. She is on daily rosuvastatin 10 mg. She is on CoQ-10. She denies myalgias.  Lab Results  Component Value Date   ALT 15 09/20/2020   AST 24 09/20/2020   ALKPHOS 76 09/20/2020   BILITOT <0.2 09/20/2020   Lab Results  Component Value Date   CHOL 227 (H) 09/20/2020   HDL 71 09/20/2020   LDLCALC 139 (H) 09/20/2020   TRIG 100 09/20/2020   CHOLHDL 4.7 (H) 03/13/2020    Assessment/Plan:   1. Elevated blood pressure reading Amy Blackwell is working on healthy weight loss and exercise to improve blood pressure control. We will watch for signs of hypotension as she continues her lifestyle modifications. Monitor BP.  2. Mixed hyperlipidemia Cardiovascular risk and specific lipid/LDL goals reviewed.  We discussed several lifestyle modifications today and Amy Blackwell will continue to work on diet,  exercise and weight loss efforts. Orders and follow up as documented in patient record. Continue current treatment plan.  Counseling Intensive lifestyle modifications are the first line treatment for this issue. . Dietary changes: Increase soluble fiber. Decrease simple carbohydrates. . Exercise changes: Moderate to vigorous-intensity aerobic activity 150 minutes per week if tolerated. . Lipid-lowering medications: see documented in medical record.  3. Class 2 severe obesity with serious comorbidity and body mass index (BMI) of 37.0 to 37.9 in adult, unspecified obesity type Amy Blackwell) Amy Blackwell is currently in the action stage of change. As such, her goal is to continue with weight loss efforts. She has agreed to keeping a food journal and adhering to recommended goals of 1200-1300 calories and 75 g protein.   Handout: Protein Snack Comparisons  Exercise goals: As is  Behavioral modification strategies: increasing lean protein intake, meal planning and cooking strategies, planning for success and keeping a strict food journal.  Amy Blackwell has agreed to follow-up with our clinic in 2 weeks. She was informed of the importance of frequent follow-up visits to maximize her success with intensive lifestyle modifications for her multiple health conditions.   Objective:   Blood pressure 139/62, pulse 63, temperature 97.6 F (36.4 C), height 5' 2"  (1.575 m), weight 202 lb (91.6 kg), SpO2 97 %. Body mass index is 36.95 kg/m.  General: Cooperative, alert, well developed, in no acute distress. HEENT: Conjunctivae and lids unremarkable. Cardiovascular: Regular rhythm.  Lungs: Normal work of  breathing. Neurologic: No focal deficits.   Lab Results  Component Value Date   CREATININE 0.90 09/20/2020   BUN 23 09/20/2020   NA 141 09/20/2020   K 4.5 09/20/2020   CL 103 09/20/2020   CO2 28 09/20/2020   Lab Results  Component Value Date   ALT 15 09/20/2020   AST 24 09/20/2020   ALKPHOS 76 09/20/2020    BILITOT <0.2 09/20/2020   Lab Results  Component Value Date   HGBA1C 5.4 03/13/2020   Lab Results  Component Value Date   INSULIN 6.0 09/20/2020   Lab Results  Component Value Date   TSH 3.250 09/20/2020   Lab Results  Component Value Date   CHOL 227 (H) 09/20/2020   HDL 71 09/20/2020   LDLCALC 139 (H) 09/20/2020   TRIG 100 09/20/2020   CHOLHDL 4.7 (H) 03/13/2020   Lab Results  Component Value Date   WBC 6.6 09/20/2020   HGB 12.9 09/20/2020   HCT 39.4 09/20/2020   MCV 87 09/20/2020   PLT 302 09/20/2020    Attestation Statements:   Reviewed by clinician on day of visit: allergies, medications, problem list, medical history, surgical history, family history, social history, and previous encounter notes.  Time spent on visit including pre-visit chart review and post-visit care and charting was 35 minutes.   Coral Ceo, am acting as Location manager for Mina Marble, NP.  I have reviewed the above documentation for accuracy and completeness, and I agree with the above. -  Leathie Weich d. Kristalyn Bergstresser, NP-C

## 2020-10-21 DIAGNOSIS — E7849 Other hyperlipidemia: Secondary | ICD-10-CM | POA: Insufficient documentation

## 2020-10-21 DIAGNOSIS — E782 Mixed hyperlipidemia: Secondary | ICD-10-CM | POA: Insufficient documentation

## 2020-10-21 DIAGNOSIS — R030 Elevated blood-pressure reading, without diagnosis of hypertension: Secondary | ICD-10-CM | POA: Insufficient documentation

## 2020-10-25 DIAGNOSIS — M1811 Unilateral primary osteoarthritis of first carpometacarpal joint, right hand: Secondary | ICD-10-CM | POA: Diagnosis not present

## 2020-10-25 DIAGNOSIS — M25572 Pain in left ankle and joints of left foot: Secondary | ICD-10-CM | POA: Diagnosis not present

## 2020-10-25 DIAGNOSIS — M79675 Pain in left toe(s): Secondary | ICD-10-CM | POA: Diagnosis not present

## 2020-10-25 DIAGNOSIS — M79605 Pain in left leg: Secondary | ICD-10-CM | POA: Diagnosis not present

## 2020-11-01 DIAGNOSIS — M25572 Pain in left ankle and joints of left foot: Secondary | ICD-10-CM | POA: Diagnosis not present

## 2020-11-01 DIAGNOSIS — M79672 Pain in left foot: Secondary | ICD-10-CM | POA: Diagnosis not present

## 2020-11-01 DIAGNOSIS — M19072 Primary osteoarthritis, left ankle and foot: Secondary | ICD-10-CM | POA: Diagnosis not present

## 2020-11-05 ENCOUNTER — Telehealth (INDEPENDENT_AMBULATORY_CARE_PROVIDER_SITE_OTHER): Payer: PPO | Admitting: Adult Health

## 2020-11-05 ENCOUNTER — Other Ambulatory Visit: Payer: Self-pay

## 2020-11-05 ENCOUNTER — Encounter (INDEPENDENT_AMBULATORY_CARE_PROVIDER_SITE_OTHER): Payer: Self-pay | Admitting: Adult Health

## 2020-11-05 DIAGNOSIS — E7849 Other hyperlipidemia: Secondary | ICD-10-CM | POA: Diagnosis not present

## 2020-11-05 DIAGNOSIS — E559 Vitamin D deficiency, unspecified: Secondary | ICD-10-CM

## 2020-11-05 DIAGNOSIS — Z6836 Body mass index (BMI) 36.0-36.9, adult: Secondary | ICD-10-CM

## 2020-11-05 DIAGNOSIS — R030 Elevated blood-pressure reading, without diagnosis of hypertension: Secondary | ICD-10-CM | POA: Diagnosis not present

## 2020-11-05 MED ORDER — VITAMIN D (ERGOCALCIFEROL) 1.25 MG (50000 UNIT) PO CAPS: 50000.0000 [IU] | ORAL_CAPSULE | ORAL | 0 refills | Status: AC

## 2020-11-06 NOTE — Progress Notes (Signed)
TeleHealth Visit:  Due to the COVID-19 pandemic, this visit was completed with telemedicine (audio/video) technology to reduce patient and provider exposure as well as to preserve personal protective equipment.   Jakya has verbally consented to this TeleHealth visit. The patient is located at home, the provider is located at the [facility] office. The participants in this visit include the listed provider and patient. The visit was conducted today via video.   Chief Complaint: OBESITY Janelle is here to discuss her progress with her obesity treatment plan along with follow-up of her obesity related diagnoses. Morayo is on keeping a food journal and adhering to recommended goals of 1200-1300 calories and 75 g protein and states she is following her eating plan approximately 0% of the time. Oliver states she is doing aquatics 60 minutes 1 times per week.  Today's visit was #: 4 Starting weight: 208 lbs Starting date: 09/20/2020  Interim History: Sherena was exposed to COVID-19 by her brother who recently tested positive. She denies any acute symptoms. She is unvaccinated and did not test for infection. She has been caring for her brother. She is his primary caregiver.  Subjective:   1. Hyperlipidemia, pure Nabila is on Crestor 20 mg daily. Her 09/20/2020 Lipid panel- total and LDL are both above goal.  2. Vitamin D deficiency Venie's Vitamin D level was 23.6 on 09/20/2020. She is currently taking prescription vitamin D 50,000 IU each week. She denies nausea, vomiting or muscle weakness.   Ref. Range 09/20/2020 09:58  Vitamin D, 25-Hydroxy Latest Ref Range: 30.0 - 100.0 ng/mL 23.6 (L)   3. Elevated blood pressure reading Carah's ambulatory BP reading is 118/77. She is not on any anti-hypertensive.  BP Readings from Last 3 Encounters:  10/17/20 139/62  10/04/20 (!) 141/81  09/20/20 116/70    Assessment/Plan:   1. Hyperlipidemia, pure Cardiovascular risk and specific  lipid/LDL goals reviewed.  We discussed several lifestyle modifications today and Danita will continue to work on diet, exercise and weight loss efforts. Orders and follow up as documented in patient record. Continue statin therapy and regular exercise.  Counseling Intensive lifestyle modifications are the first line treatment for this issue. . Dietary changes: Increase soluble fiber. Decrease simple carbohydrates. . Exercise changes: Moderate to vigorous-intensity aerobic activity 150 minutes per week if tolerated. . Lipid-lowering medications: see documented in medical record.  2. Vitamin D deficiency Low Vitamin D level contributes to fatigue and are associated with obesity, breast, and colon cancer. She agrees to continue to take prescription Vitamin D @50 ,000 IU every week and will follow-up for routine testing of Vitamin D, at least 2-3 times per year to avoid over-replacement.  - Vitamin D, Ergocalciferol, (DRISDOL) 1.25 MG (50000 UNIT) CAPS capsule; Take 1 capsule (50,000 Units total) by mouth every 7 (seven) days.  Dispense: 4 capsule; Refill: 0  3. Elevated blood pressure reading Mylynn is working on healthy weight loss and exercise to improve blood pressure control. We will watch for signs of hypotension as she continues her lifestyle modifications. Continue to limit sodium. Continue to monitor BP.  4. Class 2 severe obesity with serious comorbidity and body mass index (BMI) of 36.0 to 36.9 in adult, unspecified obesity type [facility]) Pallavi is currently in the action stage of change. As such, her goal is to continue with weight loss efforts. She has agreed to keeping a food journal and adhering to recommended goals of 1200-1300 calories and 75 g protein.   She is interested in  continue low carb plan at next OV.  Exercise goals: As is  Behavioral modification strategies: increasing lean protein intake, meal planning and cooking strategies and planning for success.  Sritha has agreed to  follow-up with our clinic in 2 weeks. She was informed of the importance of frequent follow-up visits to maximize her success with intensive lifestyle modifications for her multiple health conditions.  Objective:   VITALS: Per patient if applicable, see vitals. GENERAL: Alert and in no acute distress. CARDIOPULMONARY: No increased WOB. Speaking in clear sentences.  PSYCH: Pleasant and cooperative. Speech normal rate and rhythm. Affect is appropriate. Insight and judgement are appropriate. Attention is focused, linear, and appropriate.  NEURO: Oriented as arrived to appointment on time with no prompting.   Lab Results  Component Value Date   CREATININE 0.90 09/20/2020   BUN 23 09/20/2020   NA 141 09/20/2020   K 4.5 09/20/2020   CL 103 09/20/2020   CO2 28 09/20/2020   Lab Results  Component Value Date   ALT 15 09/20/2020   AST 24 09/20/2020   ALKPHOS 76 09/20/2020   BILITOT <0.2 09/20/2020   Lab Results  Component Value Date   HGBA1C 5.4 03/13/2020   Lab Results  Component Value Date   INSULIN 6.0 09/20/2020   Lab Results  Component Value Date   TSH 3.250 09/20/2020   Lab Results  Component Value Date   CHOL 227 (H) 09/20/2020   HDL 71 09/20/2020   LDLCALC 139 (H) 09/20/2020   TRIG 100 09/20/2020   CHOLHDL 4.7 (H) 03/13/2020   Lab Results  Component Value Date   WBC 6.6 09/20/2020   HGB 12.9 09/20/2020   HCT 39.4 09/20/2020   MCV 87 09/20/2020   PLT 302 09/20/2020   No results found for: IRON, TIBC, FERRITIN  Attestation Statements:   Reviewed by clinician on day of visit: allergies, medications, problem list, medical history, surgical history, family history, social history, and previous encounter notes.  Time spent on visit including pre-visit chart review and post-visit charting and care was 32 minutes.   Coral Ceo, am acting as Location manager for Mina Marble, NP.  I have reviewed the above documentation for accuracy and completeness, and I  agree with the above. - Sharra Cayabyab d. Carlo Lorson, NP-C

## 2020-11-08 DIAGNOSIS — E039 Hypothyroidism, unspecified: Secondary | ICD-10-CM | POA: Insufficient documentation

## 2020-11-08 DIAGNOSIS — I10 Essential (primary) hypertension: Secondary | ICD-10-CM | POA: Insufficient documentation

## 2020-11-08 NOTE — Progress Notes (Signed)
Cardiology Office Note   Date:  11/11/2020   ID:  Amy Blackwell, DOB 1951/06/23, MRN [id number]  PCP:  Hayden Rasmussen, MD  Cardiologist:   Minus Breeding, MD   Chief Complaint  Patient presents with  . Weight Gain      History of Present Illness: Amy Blackwell is a 70 y.o. female who for evaluation of chest pain. She had problems with fatty liver and gallbladder polyps.  She was referred by Hayden Rasmussen, MD secondary to evidence of small pericardial effusion and some coronary calcification noted on a CT. I sent her for a Lexiscan Myoview in January 2020.  This was negative for ischemia.  She had an echo with only trivial effusion in 2020.   She had low heart rate in 2021.  However, monitor in August demonstrated NSR, short runs of SVT and no sustained bradyarrhythmias.    Since I last saw her she has gained some weight with decreased exercise.  she has joined a gym.  The patient denies any new symptoms such as chest discomfort, neck or arm discomfort. There has been no new shortness of breath, PND or orthopnea. There have been no reported palpitations, presyncope or syncope.    Past Medical History:  Diagnosis Date  . ADD (attention deficit disorder)   . ADHD   . Anemia   . Anxiety   . Arthritis   . Asthma   . Back pain   . Bursitis   . Chest pain   . Constipation   . Depression   . Fatty liver   . Fibromyalgia   . Food allergy   . Gallbladder problem   . Hypercholesterolemia   . Hypothyroidism   . Joint pain   . Kidney problem   . Osteoarthritis   . Osteoporosis   . Other fatigue   . Shortness of breath   . Shortness of breath on exertion   . Thoracic spondylosis   . Thyroid disease   . Vitamin D deficiency     Past Surgical History:  Procedure Laterality Date  . EYE SURGERY    . KNEE SURGERY       Current Outpatient Medications  Medication Sig Dispense Refill  . albuterol (VENTOLIN HFA) 108 (90 Base) MCG/ACT inhaler Inhale into the lungs as  needed for wheezing or shortness of breath.    Marland Kitchen Bioflavonoid Products (SUPER-C 1000 PO) Take 1 capsule by mouth daily.    . Coenzyme Q10 300 MG CAPS Take 1 capsule by mouth daily.    . Fluticasone-Salmeterol (ADVAIR) 500-50 MCG/DOSE AEPB Inhale 1 puff into the lungs as needed.    Marland Kitchen ibuprofen (ADVIL) 200 MG tablet Take 200 mg by mouth every 6 (six) hours as needed.    . lactobacillus acidophilus (BACID) TABS tablet Take 1 tablet by mouth daily.    Marland Kitchen levothyroxine (SYNTHROID) 88 MCG tablet Take 88 mcg by mouth daily before breakfast.    . magnesium oxide (MAG-OX) 400 MG tablet Take 400 mg by mouth daily.    . Melatonin 10 MG TABS Take 1 tablet by mouth at bedtime as needed.    . meloxicam (MOBIC) 15 MG tablet Take 15 mg by mouth daily.    . Omega-3 Fatty Acids (SUPER OMEGA 3 EPA/DHA PO) Take 1 capsule by mouth daily.    . rosuvastatin (CRESTOR) 20 MG tablet Take 20 mg by mouth daily.    . Vitamin D, Ergocalciferol, (DRISDOL) 1.25 MG (50000 UNIT) CAPS capsule  Take 1 capsule (50,000 Units total) by mouth every 7 (seven) days. 4 capsule 0  . vitamin E 180 MG (400 UNITS) capsule Take 400 Units by mouth daily.     No current facility-administered medications for this visit.    Allergies:   Codeine, Shellfish allergy, and Tetanus toxoids    ROS:  Please see the history of present illness.   Otherwise, review of systems are positive for none.   All other systems are reviewed and negative.    PHYSICAL EXAM: VS:  BP 128/70   Pulse 78   Ht 5' 1.75" (1.568 m)   Wt 208 lb 9.6 oz (94.6 kg)   SpO2 95%   BMI 38.46 kg/m  , BMI Body mass index is 38.46 kg/m. GENERAL:  Well appearing NECK:  No jugular venous distention, waveform within normal limits, carotid upstroke brisk and symmetric, no bruits, no thyromegaly LUNGS:  Clear to auscultation bilaterally CHEST:  Unremarkable HEART:  PMI not displaced or sustained,S1 and S2 within normal limits, no S3, no S4, no clicks, no rubs, no murmurs ABD:   Flat, positive bowel sounds normal in frequency in pitch, no bruits, no rebound, no guarding, no midline pulsatile mass, no hepatomegaly, no splenomegaly EXT:  2 plus pulses throughout, no edema, no cyanosis no clubbing   EKG:  EKG is not ordered today.   Recent Labs: 03/13/2020: Magnesium 2.2 09/20/2020: ALT 15; BUN 23; Creatinine, Ser 0.90; Hemoglobin 12.9; Platelets 302; Potassium 4.5; Sodium 141; TSH 3.250    Lipid Panel    Component Value Date/Time   CHOL 227 (H) 09/20/2020 0958   TRIG 100 09/20/2020 0958   HDL 71 09/20/2020 0958   CHOLHDL 4.7 (H) 03/13/2020 1003   LDLCALC 139 (H) 09/20/2020 0958      Wt Readings from Last 3 Encounters:  11/09/20 208 lb 9.6 oz (94.6 kg)  10/17/20 202 lb (91.6 kg)  10/04/20 205 lb (93 kg)      Other studies Reviewed: Additional studies/ records that were reviewed today include:   None. Review of the above records demonstrates:  NA   ASSESSMENT AND PLAN:   Bradycardia:    She is not particularly bothered by this.  She has had no presyncope or syncope.  No change in therapy.   Call [name] with the results and send results to Hayden Rasmussen, MD  Hypertension:   Her BP is controlled.  No change in therapy.   Morbid obesity:     She is working with a weight loss program but is not pleased.  She thinks she is going to try Keto again.   Dyslipidemia:  LDL was 139.   I explained that this was not going to improve on the keto diet but she will get to a lower cholesterol diet after she loses weight with Keto and then would get a repeat lipid by her PCP and treat per guidelines.    The following changes have been made:  no change  Labs/ tests ordered today include: None No orders of the defined types were placed in this encounter.    Disposition:   FU with me as needed.      Signed, Minus Breeding, MD  11/11/2020 4:04 PM    [facility]

## 2020-11-09 ENCOUNTER — Other Ambulatory Visit: Payer: Self-pay

## 2020-11-09 ENCOUNTER — Ambulatory Visit: Payer: PPO | Admitting: Cardiology

## 2020-11-09 ENCOUNTER — Encounter: Payer: Self-pay | Admitting: Cardiology

## 2020-11-09 VITALS — BP 128/70 | HR 78 | Ht 61.75 in | Wt 208.6 lb

## 2020-11-09 DIAGNOSIS — I10 Essential (primary) hypertension: Secondary | ICD-10-CM | POA: Diagnosis not present

## 2020-11-09 DIAGNOSIS — R001 Bradycardia, unspecified: Secondary | ICD-10-CM | POA: Diagnosis not present

## 2020-11-09 DIAGNOSIS — E039 Hypothyroidism, unspecified: Secondary | ICD-10-CM

## 2020-11-09 NOTE — Patient Instructions (Addendum)
Medication Instructions:  The current medical regimen is effective;  continue present plan and medications as directed. Please refer to the Current Medication list given to you today.  *If you need a refill on your cardiac medications before your next appointment, please call your pharmacy*  Lab Work:   Testing/Procedures:  NONE    NONE  Follow-Up: Your next appointment:  AS NEEDED  with Minus Breeding, MD   At [facility], you and your health needs are our priority.  As part of our continuing mission to provide you with exceptional heart care, we have created designated Provider Care Teams.  These Care Teams include your primary Cardiologist (physician) and Advanced Practice Providers (APPs -  Physician Assistants and Nurse Practitioners) who all work together to provide you with the care you need, when you need it.

## 2020-11-16 DIAGNOSIS — G4733 Obstructive sleep apnea (adult) (pediatric): Secondary | ICD-10-CM | POA: Diagnosis not present

## 2020-11-16 DIAGNOSIS — J4540 Moderate persistent asthma, uncomplicated: Secondary | ICD-10-CM | POA: Diagnosis not present

## 2020-11-16 DIAGNOSIS — I10 Essential (primary) hypertension: Secondary | ICD-10-CM | POA: Diagnosis not present

## 2020-11-16 DIAGNOSIS — J0100 Acute maxillary sinusitis, unspecified: Secondary | ICD-10-CM | POA: Diagnosis not present

## 2020-11-22 ENCOUNTER — Ambulatory Visit (INDEPENDENT_AMBULATORY_CARE_PROVIDER_SITE_OTHER): Payer: PPO | Admitting: Adult Health

## 2020-11-22 ENCOUNTER — Encounter (INDEPENDENT_AMBULATORY_CARE_PROVIDER_SITE_OTHER): Payer: Self-pay | Admitting: Adult Health

## 2020-11-22 ENCOUNTER — Other Ambulatory Visit: Payer: Self-pay

## 2020-11-22 VITALS — BP 125/68 | HR 63 | Temp 98.1°F | Ht 62.0 in | Wt 200.0 lb

## 2020-11-22 DIAGNOSIS — Z6838 Body mass index (BMI) 38.0-38.9, adult: Secondary | ICD-10-CM | POA: Diagnosis not present

## 2020-11-22 DIAGNOSIS — E7849 Other hyperlipidemia: Secondary | ICD-10-CM

## 2020-11-22 DIAGNOSIS — E559 Vitamin D deficiency, unspecified: Secondary | ICD-10-CM

## 2020-11-26 NOTE — Progress Notes (Signed)
Chief Complaint:   OBESITY Amy Blackwell is here to discuss her progress with her obesity treatment plan along with follow-up of her obesity related diagnoses. Amy Blackwell is on keeping a food journal and adhering to recommended goals of 1200-1300 calories and 75 g protein and states she is following her eating plan approximately 100% of the time. Amy Blackwell states she is doing 0 minutes 0 times per week.  Today's visit was #: 5 Starting weight: 208 lbs Starting date: 09/20/2020 Today's weight: 200 lbs Today's date: 11/22/2020 Total lbs lost to date: 8 lbs Total lbs lost since last in-office visit: 2 lbs  Interim History: Amy Blackwell was seen by cardiology 11/09/2020, with no change in cardiac meds. She has been tracking intake 100% via My Fitness Pal and hitting 1200-1300 calories and at least 75 g protein a day- often more. She has been following intermittent fasting 20/4 or 18/6- depending on the day.  However she has been consuming coffee with creamer every morning during "fast".  Subjective:   1. Vitamin D deficiency Amy Blackwell Vitamin D level was 23.6 on 09/20/2020, which is below goal of 50. She is currently taking prescription vitamin D 50,000 IU each week. She denies nausea, vomiting or muscle weakness.   Ref. Range 09/20/2020 09:58  Vitamin D, 25-Hydroxy Latest Ref Range: 30.0 - 100.0 ng/mL 23.6 (L)   2. Hyperlipidemia, pure Amy Blackwell has had a fantastic response to Crestor 20 mg QD. Her 03/13/2020 LDL was 231 and 09/20/2020 LDL 139. Cardiology is managing HLD. She denies myalgias, however, she tells me that her memory has declined since starting Crestor. We discussed risks/benefits of starting therapy and that dangerously elevated LDL increases risk of stroke and myocardial infarction.  Lab Results  Component Value Date   ALT 15 09/20/2020   AST 24 09/20/2020   ALKPHOS 76 09/20/2020   BILITOT <0.2 09/20/2020   Lab Results  Component Value Date   CHOL 227 (H) 09/20/2020   HDL 71 09/20/2020   LDLCALC  139 (H) 09/20/2020   TRIG 100 09/20/2020   CHOLHDL 4.7 (H) 03/13/2020    Assessment/Plan:   1. Vitamin D deficiency Low Vitamin D level contributes to fatigue and are associated with obesity, breast, and colon cancer. She agrees to continue to take prescription Vitamin D @50 ,000 IU every week and will follow-up for routine testing of Vitamin D, at least 2-3 times per year to avoid over-replacement.  2. Hyperlipidemia, pure Cardiovascular risk and specific lipid/LDL goals reviewed.  We discussed several lifestyle modifications today and Amy Blackwell will continue to work on diet, exercise and weight loss efforts. Orders and follow up as documented in patient record. Continue Crestor 20 mg QD. Decrease saturated fat. I do not recommend Keto Diet.  Counseling Intensive lifestyle modifications are the first line treatment for this issue. . Dietary changes: Increase soluble fiber. Decrease simple carbohydrates. . Exercise changes: Moderate to vigorous-intensity aerobic activity 150 minutes per week if tolerated. . Lipid-lowering medications: see documented in medical record.  3. Current BMI 36.7 Amy Blackwell is currently in the action stage of change. As such, her goal is to continue with weight loss efforts. She has agreed to keeping a food journal and adhering to recommended goals of 1200-1300 calories and 75 g protein.   Exercise goals: No exercise has been prescribed at this time.  Behavioral modification strategies: increasing lean protein intake, meal planning and cooking strategies and planning for success.  Amy Blackwell has agreed to follow-up with our clinic in 2 weeks. She was  informed of the importance of frequent follow-up visits to maximize her success with intensive lifestyle modifications for her multiple health conditions.   Objective:   Blood pressure 125/68, pulse 63, temperature 98.1 F (36.7 C), height 5' 2"  (1.575 m), weight 200 lb (90.7 kg), SpO2 95 %. Body mass index is 36.58  kg/m.  General: Cooperative, alert, well developed, in no acute distress. HEENT: Conjunctivae and lids unremarkable. Cardiovascular: Regular rhythm.  Lungs: Normal work of breathing. Neurologic: No focal deficits.   Lab Results  Component Value Date   CREATININE 0.90 09/20/2020   BUN 23 09/20/2020   NA 141 09/20/2020   K 4.5 09/20/2020   CL 103 09/20/2020   CO2 28 09/20/2020   Lab Results  Component Value Date   ALT 15 09/20/2020   AST 24 09/20/2020   ALKPHOS 76 09/20/2020   BILITOT <0.2 09/20/2020   Lab Results  Component Value Date   HGBA1C 5.4 03/13/2020   Lab Results  Component Value Date   INSULIN 6.0 09/20/2020   Lab Results  Component Value Date   TSH 3.250 09/20/2020   Lab Results  Component Value Date   CHOL 227 (H) 09/20/2020   HDL 71 09/20/2020   LDLCALC 139 (H) 09/20/2020   TRIG 100 09/20/2020   CHOLHDL 4.7 (H) 03/13/2020   Lab Results  Component Value Date   WBC 6.6 09/20/2020   HGB 12.9 09/20/2020   HCT 39.4 09/20/2020   MCV 87 09/20/2020   PLT 302 09/20/2020    Attestation Statements:   Reviewed by clinician on day of visit: allergies, medications, problem list, medical history, surgical history, family history, social history, and previous encounter notes.  Time spent on visit including pre-visit chart review and post-visit care and charting was 32 minutes.   Coral Ceo, am acting as Location manager for Mina Marble, NP.  I have reviewed the above documentation for accuracy and completeness, and I agree with the above. -  Khamarion Bjelland d. Areonna Bran, NP-C

## 2020-12-10 ENCOUNTER — Ambulatory Visit (INDEPENDENT_AMBULATORY_CARE_PROVIDER_SITE_OTHER): Payer: PPO | Admitting: Adult Health

## 2020-12-10 DIAGNOSIS — I10 Essential (primary) hypertension: Secondary | ICD-10-CM | POA: Diagnosis not present

## 2020-12-10 DIAGNOSIS — Z0001 Encounter for general adult medical examination with abnormal findings: Secondary | ICD-10-CM | POA: Diagnosis not present

## 2020-12-10 DIAGNOSIS — E559 Vitamin D deficiency, unspecified: Secondary | ICD-10-CM | POA: Diagnosis not present

## 2020-12-10 DIAGNOSIS — E039 Hypothyroidism, unspecified: Secondary | ICD-10-CM | POA: Diagnosis not present

## 2020-12-10 DIAGNOSIS — E785 Hyperlipidemia, unspecified: Secondary | ICD-10-CM | POA: Diagnosis not present

## 2020-12-10 DIAGNOSIS — F39 Unspecified mood [affective] disorder: Secondary | ICD-10-CM | POA: Diagnosis not present

## 2020-12-10 DIAGNOSIS — J4540 Moderate persistent asthma, uncomplicated: Secondary | ICD-10-CM | POA: Diagnosis not present

## 2020-12-12 DIAGNOSIS — H35341 Macular cyst, hole, or pseudohole, right eye: Secondary | ICD-10-CM | POA: Diagnosis not present

## 2020-12-12 DIAGNOSIS — H35371 Puckering of macula, right eye: Secondary | ICD-10-CM | POA: Diagnosis not present

## 2020-12-17 DIAGNOSIS — H35341 Macular cyst, hole, or pseudohole, right eye: Secondary | ICD-10-CM | POA: Diagnosis not present

## 2020-12-24 ENCOUNTER — Encounter (INDEPENDENT_AMBULATORY_CARE_PROVIDER_SITE_OTHER): Payer: PPO | Admitting: Ophthalmology

## 2020-12-24 ENCOUNTER — Other Ambulatory Visit: Payer: Self-pay

## 2020-12-24 DIAGNOSIS — H35341 Macular cyst, hole, or pseudohole, right eye: Secondary | ICD-10-CM | POA: Diagnosis present

## 2020-12-24 DIAGNOSIS — H2513 Age-related nuclear cataract, bilateral: Secondary | ICD-10-CM | POA: Diagnosis not present

## 2020-12-24 DIAGNOSIS — H43813 Vitreous degeneration, bilateral: Secondary | ICD-10-CM

## 2020-12-24 NOTE — H&P (Signed)
Amy Blackwell is an 70 y.o. female.   Chief Complaint:loss of vision right eye HPI: vision loss over past several months right eye  Past Medical History:  Diagnosis Date  . ADD (attention deficit disorder)   . ADHD   . Anemia   . Anxiety   . Arthritis   . Asthma   . Back pain   . Bursitis   . Chest pain   . Constipation   . Depression   . Fatty liver   . Fibromyalgia   . Food allergy   . Gallbladder problem   . Hypercholesterolemia   . Hypothyroidism   . Joint pain   . Kidney problem   . Osteoarthritis   . Osteoporosis   . Other fatigue   . Shortness of breath   . Shortness of breath on exertion   . Thoracic spondylosis   . Thyroid disease   . Vitamin D deficiency     Past Surgical History:  Procedure Laterality Date  . EYE SURGERY    . KNEE SURGERY      Family History  Problem Relation Age of Onset  . Dementia Mother   . Hypertension Mother   . Hyperlipidemia Mother   . Prostate cancer Father   . Peripheral vascular disease Father   . Hyperlipidemia Father   . Heart disease Father   . Stroke Father   . Kidney disease Father   . CAD Brother 19   Social History:  reports that she has quit smoking. Her smoking use included cigarettes. She has never used smokeless tobacco. She reports that she does not drink alcohol and does not use drugs.  Allergies:  Allergies  Allergen Reactions  . Codeine     Makes pt hyper  . Shellfish Allergy   . Tetanus Toxoids     No medications prior to admission.    Review of systems otherwise negative  There were no vitals taken for this visit.  Physical exam: Mental status: oriented x3. Eyes: See eye exam associated with this date of surgery in media tab.  Scanned in by scanning center Ears, Nose, Throat: within normal limits Neck: Within Normal limits General: within normal limits Chest: Within normal limits Breast: deferred Heart: Within normal limits Abdomen: Within normal limits GU: deferred Extremities:  within normal limits Skin: within normal limits  Assessment/Plan Full thickness macular hole right eye Plan: To [facility] for Pars plana vitrectomy, membrane peel, laser, gas injection right eye  Hayden Pedro 12/24/2020, 5:18 PM

## 2020-12-27 DIAGNOSIS — I10 Essential (primary) hypertension: Secondary | ICD-10-CM | POA: Diagnosis not present

## 2020-12-27 DIAGNOSIS — H33301 Unspecified retinal break, right eye: Secondary | ICD-10-CM | POA: Diagnosis not present

## 2020-12-27 DIAGNOSIS — L03012 Cellulitis of left finger: Secondary | ICD-10-CM | POA: Diagnosis not present

## 2020-12-27 DIAGNOSIS — E039 Hypothyroidism, unspecified: Secondary | ICD-10-CM | POA: Diagnosis not present

## 2021-01-03 ENCOUNTER — Telehealth: Payer: Self-pay | Admitting: *Deleted

## 2021-01-03 NOTE — Telephone Encounter (Signed)
   [facility] Pre-operative Risk Assessment    Patient Name: Amy Blackwell  DOB: 1951-07-23  MRN: [id number]     Request for surgical clearance:  1. What type of surgery is being performed? RETINA - OPHTHALMIC SURGERY    2. When is this surgery scheduled? 01-15-2021   3. What type of clearance is required (medical clearance vs. Pharmacy clearance to hold med vs. Both)? CLEARANCE  4. Are there any medications that need to be held prior to surgery and how long?   5. Practice name and name of physician performing surgery? DR Jenny Reichmann [facility]    6. What is the office phone number? [phone]   7.   What is the office fax number? [phone]  8.   Anesthesia type (None, local, MAC, general) ? GENERAL

## 2021-01-04 NOTE — Telephone Encounter (Signed)
   Name: Amy Blackwell  DOB: [birth year]  MRN: [id number]   Primary Cardiologist: Minus Breeding, MD  Chart reviewed as part of pre-operative protocol coverage. Patient was contacted 01/04/2021 in reference to pre-operative risk assessment for pending surgery as outlined below.  Amy Blackwell was last seen on 11/09/20 by Dr. Percival Spanish.  Since that day, Amy Blackwell has done fine from a cardiac standpoint. She can complete 4 METs without anginal complaints.  Therefore, based on ACC/AHA guidelines, the patient would be at acceptable risk for the planned procedure without further cardiovascular testing.   The patient was advised that if she develops new symptoms prior to surgery to contact our office to arrange for a follow-up visit, and she verbalized understanding.  I will route this recommendation to the requesting party via Epic fax function and remove from pre-op pool. Please call with questions.  Abigail Butts, PA-C 01/04/2021, 11:41 AM

## 2021-01-11 ENCOUNTER — Other Ambulatory Visit (HOSPITAL_COMMUNITY)
Admission: RE | Admit: 2021-01-11 | Discharge: 2021-01-11 | Disposition: A | Discharge status: Home / Self Care | Payer: PPO | Source: Ambulatory Visit | Attending: Ophthalmology | Admitting: Ophthalmology

## 2021-01-11 DIAGNOSIS — Z01812 Encounter for preprocedural laboratory examination: Secondary | ICD-10-CM | POA: Diagnosis present

## 2021-01-11 DIAGNOSIS — Z20822 Contact with and (suspected) exposure to covid-19: Secondary | ICD-10-CM | POA: Insufficient documentation

## 2021-01-12 LAB — SARS CORONAVIRUS 2 (TAT 6-24 HRS): SARS Coronavirus 2: NEGATIVE

## 2021-01-14 ENCOUNTER — Other Ambulatory Visit: Payer: Self-pay

## 2021-01-14 ENCOUNTER — Encounter (HOSPITAL_COMMUNITY): Payer: Self-pay | Admitting: Ophthalmology

## 2021-01-14 NOTE — Progress Notes (Addendum)
Amy Blackwell denies chest pain or shortness of breath. Patient was tested for Covid and has been in quarantine since that time.  Amy Blackwell's pulse is usually slow, in the 40's.  Patient was cleared for surgery by  Cherlynn Polo, PA-C.  Dr. Percival Spanish is patients cardiologist. Amy. Blackwell states that she has white coat syndrome, patient has taken blood pressure medications in the past, but has lost approximately 91 lbs.  Patient was diagnosed with Sleep apnea  she no longer wears a CPAP, "I don't have sleep apnea any longer."  Dr. Eliberto Ivory is Amy Blackwell PCP, I requested records.

## 2021-01-15 ENCOUNTER — Ambulatory Visit (HOSPITAL_COMMUNITY): Payer: PPO | Admitting: Certified Registered Nurse Anesthetist

## 2021-01-15 ENCOUNTER — Encounter (HOSPITAL_COMMUNITY): Payer: Self-pay | Admitting: Ophthalmology

## 2021-01-15 ENCOUNTER — Encounter (HOSPITAL_COMMUNITY)
Admission: RE | Disposition: A | Discharge status: Home / Self Care | Payer: Self-pay | Source: Home / Self Care | Attending: Ophthalmology

## 2021-01-15 ENCOUNTER — Ambulatory Visit (HOSPITAL_COMMUNITY)
Admission: RE | Admit: 2021-01-15 | Discharge: 2021-01-16 | Disposition: A | Discharge status: Home / Self Care | Payer: PPO | Attending: Ophthalmology | Admitting: Ophthalmology

## 2021-01-15 DIAGNOSIS — E7849 Other hyperlipidemia: Secondary | ICD-10-CM | POA: Diagnosis not present

## 2021-01-15 DIAGNOSIS — H35341 Macular cyst, hole, or pseudohole, right eye: Secondary | ICD-10-CM | POA: Diagnosis present

## 2021-01-15 DIAGNOSIS — E559 Vitamin D deficiency, unspecified: Secondary | ICD-10-CM | POA: Diagnosis not present

## 2021-01-15 DIAGNOSIS — E039 Hypothyroidism, unspecified: Secondary | ICD-10-CM | POA: Diagnosis not present

## 2021-01-15 DIAGNOSIS — Z87891 Personal history of nicotine dependence: Secondary | ICD-10-CM | POA: Diagnosis not present

## 2021-01-15 DIAGNOSIS — Z885 Allergy status to narcotic agent status: Secondary | ICD-10-CM | POA: Insufficient documentation

## 2021-01-15 DIAGNOSIS — Z887 Allergy status to serum and vaccine status: Secondary | ICD-10-CM | POA: Insufficient documentation

## 2021-01-15 DIAGNOSIS — Z6839 Body mass index (BMI) 39.0-39.9, adult: Secondary | ICD-10-CM | POA: Diagnosis not present

## 2021-01-15 HISTORY — DX: Other complications of anesthesia, initial encounter: T88.59XA

## 2021-01-15 HISTORY — DX: Personal history of other medical treatment: Z92.89

## 2021-01-15 HISTORY — PX: 25 GAUGE PARS PLANA VITRECTOMY WITH 20 GAUGE MVR PORT FOR MACULAR HOLE: SHX6096

## 2021-01-15 HISTORY — DX: Pneumonia, unspecified organism: J18.9

## 2021-01-15 LAB — CBC
HCT: 43.2 % (ref 36.0–46.0)
Hemoglobin: 13.8 g/dL (ref 12.0–15.0)
MCH: 28.3 pg (ref 26.0–34.0)
MCHC: 31.9 g/dL (ref 30.0–36.0)
MCV: 88.5 fL (ref 80.0–100.0)
Platelets: 256 10*3/uL (ref 150–400)
RBC: 4.88 MIL/uL (ref 3.87–5.11)
RDW: 13.5 % (ref 11.5–15.5)
WBC: 7.4 10*3/uL (ref 4.0–10.5)
nRBC: 0 % (ref 0.0–0.2)

## 2021-01-15 LAB — COMPREHENSIVE METABOLIC PANEL
ALT: 15 U/L (ref 0–44)
AST: 21 U/L (ref 15–41)
Albumin: 4.1 g/dL (ref 3.5–5.0)
Alkaline Phosphatase: 73 U/L (ref 38–126)
Anion gap: 7 (ref 5–15)
BUN: 14 mg/dL (ref 8–23)
CO2: 28 mmol/L (ref 22–32)
Calcium: 9.5 mg/dL (ref 8.9–10.3)
Chloride: 105 mmol/L (ref 98–111)
Creatinine, Ser: 0.79 mg/dL (ref 0.44–1.00)
GFR, Estimated: >60 mL/min (ref >60)
Glucose, Bld: 95 mg/dL (ref 70–99)
Potassium: 4 mmol/L (ref 3.5–5.1)
Sodium: 140 mmol/L (ref 135–145)
Total Bilirubin: 0.7 mg/dL (ref 0.3–1.2)
Total Protein: 7 g/dL (ref 6.5–8.1)

## 2021-01-15 LAB — AUTOLOGOUS SERUM PATCH PREP

## 2021-01-15 SURGERY — 25 GAUGE PARS PLANA VITRECTOMY WITH 20 GAUGE MVR PORT FOR MACULAR HOLE
Anesthesia: General | Site: Eye | Laterality: Right

## 2021-01-15 MED ORDER — CHLORHEXIDINE GLUCONATE 0.12 % MT SOLN
OROMUCOSAL | Status: AC
  Filled 2021-01-15: qty 15

## 2021-01-15 MED ORDER — OXYCODONE HCL 5 MG PO TABS
5.0000 mg | ORAL_TABLET | Freq: Once | ORAL | Status: DC | PRN
Start: 2021-01-15 — End: 2021-01-15

## 2021-01-15 MED ORDER — SODIUM CHLORIDE 0.45 % IV SOLN: INTRAVENOUS | Status: DC

## 2021-01-15 MED ORDER — POLYMYXIN B SULFATE 500000 UNITS IJ SOLR
INTRAMUSCULAR | Status: AC
  Filled 2021-01-15: qty 500000

## 2021-01-15 MED ORDER — BSS IO SOLN
INTRAOCULAR | Status: AC
  Filled 2021-01-15: qty 15

## 2021-01-15 MED ORDER — ROCURONIUM BROMIDE 10 MG/ML (PF) SYRINGE
PREFILLED_SYRINGE | INTRAVENOUS | Status: DC | PRN
  Administered 2021-01-15: 50 mg via INTRAVENOUS

## 2021-01-15 MED ORDER — TETRACAINE HCL 0.5 % OP SOLN
2.0000 [drp] | Freq: Once | OPHTHALMIC | Status: DC
  Filled 2021-01-15: qty 4

## 2021-01-15 MED ORDER — ROSUVASTATIN CALCIUM 20 MG PO TABS
20.0000 mg | ORAL_TABLET | Freq: Every day | ORAL | Status: DC
  Administered 2021-01-15: 20 mg via ORAL
  Filled 2021-01-15: qty 1

## 2021-01-15 MED ORDER — EPINEPHRINE PF 1 MG/ML IJ SOLN
INTRAOCULAR | Status: DC | PRN
  Administered 2021-01-15: 500 mL

## 2021-01-15 MED ORDER — MORPHINE SULFATE (PF) 2 MG/ML IV SOLN: 1.0000 mg | INTRAVENOUS | Status: DC | PRN

## 2021-01-15 MED ORDER — ONDANSETRON HCL 4 MG/2ML IJ SOLN
INTRAMUSCULAR | Status: DC | PRN
  Administered 2021-01-15: 4 mg via INTRAVENOUS

## 2021-01-15 MED ORDER — ACETAMINOPHEN 325 MG PO TABS
325.0000 mg | ORAL_TABLET | ORAL | Status: DC | PRN
Start: 2021-01-15 — End: 2021-01-16
  Administered 2021-01-15: 650 mg via ORAL
  Filled 2021-01-15: qty 2

## 2021-01-15 MED ORDER — BACITRACIN-POLYMYXIN B 500-10000 UNIT/GM OP OINT
TOPICAL_OINTMENT | OPHTHALMIC | Status: DC | PRN
  Administered 2021-01-15: 1 via OPHTHALMIC

## 2021-01-15 MED ORDER — ACETAZOLAMIDE SODIUM 500 MG IJ SOLR
INTRAMUSCULAR | Status: AC
  Filled 2021-01-15: qty 500

## 2021-01-15 MED ORDER — SUGAMMADEX SODIUM 200 MG/2ML IV SOLN
INTRAVENOUS | Status: DC | PRN
  Administered 2021-01-15: 200 mg via INTRAVENOUS

## 2021-01-15 MED ORDER — LIDOCAINE HCL 2 % IJ SOLN
INTRAMUSCULAR | Status: AC
  Filled 2021-01-15: qty 20

## 2021-01-15 MED ORDER — BACITRACIN-POLYMYXIN B 500-10000 UNIT/GM OP OINT
TOPICAL_OINTMENT | OPHTHALMIC | Status: AC
  Filled 2021-01-15: qty 3.5

## 2021-01-15 MED ORDER — IBUPROFEN 200 MG PO TABS: 200.0000 mg | ORAL_TABLET | Freq: Four times a day (QID) | ORAL | Status: DC | PRN

## 2021-01-15 MED ORDER — BRIMONIDINE TARTRATE 0.2 % OP SOLN
1.0000 [drp] | Freq: Two times a day (BID) | OPHTHALMIC | Status: DC
  Filled 2021-01-15: qty 5

## 2021-01-15 MED ORDER — HYDROCODONE-ACETAMINOPHEN 5-325 MG PO TABS
1.0000 | ORAL_TABLET | ORAL | Status: DC | PRN
  Administered 2021-01-15: 2 via ORAL
  Filled 2021-01-15: qty 2

## 2021-01-15 MED ORDER — STERILE WATER FOR INJECTION IJ SOLN
INTRAMUSCULAR | Status: DC | PRN
  Administered 2021-01-15: 20 mL

## 2021-01-15 MED ORDER — PHENYLEPHRINE HCL 10 % OP SOLN
OPHTHALMIC | Status: AC
  Administered 2021-01-15: 1 [drp] via OPHTHALMIC
  Filled 2021-01-15: qty 5

## 2021-01-15 MED ORDER — BUPIVACAINE-EPINEPHRINE (PF) 0.25% -1:200000 IJ SOLN
INTRAMUSCULAR | Status: AC
  Filled 2021-01-15: qty 30

## 2021-01-15 MED ORDER — PHENYLEPHRINE HCL 10 % OP SOLN
1.0000 [drp] | Freq: Once | OPHTHALMIC | Status: AC
  Filled 2021-01-15: qty 5

## 2021-01-15 MED ORDER — ACETAMINOPHEN 160 MG/5ML PO SOLN: 325.0000 mg | ORAL | Status: DC | PRN

## 2021-01-15 MED ORDER — PROPOFOL 10 MG/ML IV BOLUS
INTRAVENOUS | Status: AC
  Filled 2021-01-15: qty 20

## 2021-01-15 MED ORDER — TRIAMCINOLONE ACETONIDE 40 MG/ML IJ SUSP
INTRAMUSCULAR | Status: AC
  Filled 2021-01-15: qty 5

## 2021-01-15 MED ORDER — GATIFLOXACIN 0.5 % OP SOLN
1.0000 [drp] | Freq: Four times a day (QID) | OPHTHALMIC | Status: DC
  Filled 2021-01-15: qty 2.5

## 2021-01-15 MED ORDER — BRIMONIDINE TARTRATE 0.15 % OP SOLN
1.0000 [drp] | Freq: Two times a day (BID) | OPHTHALMIC | Status: DC
  Filled 2021-01-15: qty 5

## 2021-01-15 MED ORDER — DEXAMETHASONE SODIUM PHOSPHATE 10 MG/ML IJ SOLN
INTRAMUSCULAR | Status: DC | PRN
  Administered 2021-01-15: 5 mg via INTRAVENOUS

## 2021-01-15 MED ORDER — GATIFLOXACIN 0.5 % OP SOLN: 1.0000 [drp] | OPHTHALMIC | Status: AC | PRN

## 2021-01-15 MED ORDER — SODIUM HYALURONATE 10 MG/ML IO SOLUTION
PREFILLED_SYRINGE | INTRAOCULAR | Status: AC
  Filled 2021-01-15: qty 0.85

## 2021-01-15 MED ORDER — EPINEPHRINE PF 1 MG/ML IJ SOLN
INTRAMUSCULAR | Status: AC
  Filled 2021-01-15: qty 1

## 2021-01-15 MED ORDER — CYCLOPENTOLATE HCL 1 % OP SOLN: 1.0000 [drp] | OPHTHALMIC | Status: AC | PRN

## 2021-01-15 MED ORDER — PHENYLEPHRINE HCL-NACL 10-0.9 MG/250ML-% IV SOLN
INTRAVENOUS | Status: DC | PRN
  Administered 2021-01-15: 20 ug/min via INTRAVENOUS

## 2021-01-15 MED ORDER — MELOXICAM 7.5 MG PO TABS
15.0000 mg | ORAL_TABLET | Freq: Every day | ORAL | Status: DC | PRN
  Filled 2021-01-15: qty 2

## 2021-01-15 MED ORDER — ONDANSETRON HCL 4 MG/2ML IJ SOLN: 4.0000 mg | Freq: Once | INTRAMUSCULAR | Status: DC | PRN

## 2021-01-15 MED ORDER — BACITRACIN-POLYMYXIN B 500-10000 UNIT/GM OP OINT
1.0000 "application " | TOPICAL_OINTMENT | Freq: Three times a day (TID) | OPHTHALMIC | Status: DC
  Administered 2021-01-15: 1 via OPHTHALMIC
  Filled 2021-01-15: qty 3.5

## 2021-01-15 MED ORDER — ALBUTEROL SULFATE HFA 108 (90 BASE) MCG/ACT IN AERS
2.0000 | INHALATION_SPRAY | Freq: Four times a day (QID) | RESPIRATORY_TRACT | Status: DC | PRN
  Filled 2021-01-15: qty 6.7

## 2021-01-15 MED ORDER — BUPIVACAINE HCL (PF) 0.75 % IJ SOLN
INTRAMUSCULAR | Status: AC
  Filled 2021-01-15: qty 10

## 2021-01-15 MED ORDER — DEXAMETHASONE SODIUM PHOSPHATE 10 MG/ML IJ SOLN
INTRAMUSCULAR | Status: AC
  Filled 2021-01-15: qty 1

## 2021-01-15 MED ORDER — SODIUM CHLORIDE 0.9 % IV SOLN: INTRAVENOUS | Status: DC

## 2021-01-15 MED ORDER — ONDANSETRON HCL 4 MG/2ML IJ SOLN: 4.0000 mg | Freq: Four times a day (QID) | INTRAMUSCULAR | Status: DC | PRN

## 2021-01-15 MED ORDER — FENTANYL CITRATE (PF) 250 MCG/5ML IJ SOLN
INTRAMUSCULAR | Status: DC | PRN
  Administered 2021-01-15: 50 ug via INTRAVENOUS

## 2021-01-15 MED ORDER — HYALURONIDASE HUMAN 150 UNIT/ML IJ SOLN
INTRAMUSCULAR | Status: AC
  Filled 2021-01-15: qty 1

## 2021-01-15 MED ORDER — DORZOLAMIDE HCL 2 % OP SOLN
1.0000 [drp] | Freq: Three times a day (TID) | OPHTHALMIC | Status: DC
  Administered 2021-01-15: 1 [drp] via OPHTHALMIC
  Filled 2021-01-15: qty 10

## 2021-01-15 MED ORDER — LACTATED RINGERS IV SOLN: INTRAVENOUS | Status: DC | PRN

## 2021-01-15 MED ORDER — LIDOCAINE 2% (20 MG/ML) 5 ML SYRINGE
INTRAMUSCULAR | Status: DC | PRN
  Administered 2021-01-15: 100 mg via INTRAVENOUS

## 2021-01-15 MED ORDER — EPHEDRINE SULFATE 50 MG/ML IJ SOLN
INTRAMUSCULAR | Status: DC | PRN
  Administered 2021-01-15: 5 mg via INTRAVENOUS

## 2021-01-15 MED ORDER — CEFTAZIDIME 1 G IJ SOLR
INTRAMUSCULAR | Status: AC
  Filled 2021-01-15: qty 1

## 2021-01-15 MED ORDER — LEVOTHYROXINE SODIUM 88 MCG PO TABS: 88.0000 ug | ORAL_TABLET | Freq: Every day | ORAL | Status: DC

## 2021-01-15 MED ORDER — ATROPINE SULFATE 1 % OP SOLN
OPHTHALMIC | Status: AC
  Filled 2021-01-15: qty 5

## 2021-01-15 MED ORDER — CEFAZOLIN SODIUM-DEXTROSE 2-4 GM/100ML-% IV SOLN
INTRAVENOUS | Status: AC
  Filled 2021-01-15: qty 100

## 2021-01-15 MED ORDER — FENTANYL CITRATE (PF) 100 MCG/2ML IJ SOLN
INTRAMUSCULAR | Status: AC
  Filled 2021-01-15: qty 2

## 2021-01-15 MED ORDER — CHLORHEXIDINE GLUCONATE 0.12 % MT SOLN
15.0000 mL | Freq: Once | OROMUCOSAL | Status: AC
  Administered 2021-01-15: 15 mL via OROMUCOSAL

## 2021-01-15 MED ORDER — ATROPINE SULFATE 1 % OP SOLN
OPHTHALMIC | Status: DC | PRN
  Administered 2021-01-15: 1 [drp] via OPHTHALMIC

## 2021-01-15 MED ORDER — MAGNESIUM HYDROXIDE 400 MG/5ML PO SUSP
15.0000 mL | Freq: Four times a day (QID) | ORAL | Status: DC | PRN
Start: 2021-01-15 — End: 2021-01-16

## 2021-01-15 MED ORDER — ORAL CARE MOUTH RINSE: 15.0000 mL | Freq: Once | OROMUCOSAL | Status: AC

## 2021-01-15 MED ORDER — PROPOFOL 10 MG/ML IV BOLUS
INTRAVENOUS | Status: DC | PRN
  Administered 2021-01-15: 100 mg via INTRAVENOUS

## 2021-01-15 MED ORDER — HYPROMELLOSE (GONIOSCOPIC) 2.5 % OP SOLN
OPHTHALMIC | Status: AC
  Filled 2021-01-15: qty 15

## 2021-01-15 MED ORDER — ACETAZOLAMIDE SODIUM 500 MG IJ SOLR
500.0000 mg | Freq: Once | INTRAMUSCULAR | Status: AC
  Administered 2021-01-16: 500 mg via INTRAVENOUS
  Filled 2021-01-15: qty 500

## 2021-01-15 MED ORDER — PREDNISOLONE ACETATE 1 % OP SUSP
1.0000 [drp] | Freq: Four times a day (QID) | OPHTHALMIC | Status: DC
  Filled 2021-01-15: qty 5

## 2021-01-15 MED ORDER — SODIUM HYALURONATE 10 MG/ML IO SOLUTION
PREFILLED_SYRINGE | INTRAOCULAR | Status: DC | PRN
  Administered 2021-01-15: 0.85 mL via INTRAOCULAR

## 2021-01-15 MED ORDER — OXYCODONE HCL 5 MG/5ML PO SOLN: 5.0000 mg | Freq: Once | ORAL | Status: DC | PRN

## 2021-01-15 MED ORDER — MIDAZOLAM HCL 2 MG/2ML IJ SOLN
INTRAMUSCULAR | Status: AC
  Filled 2021-01-15: qty 2

## 2021-01-15 MED ORDER — TROPICAMIDE 1 % OP SOLN
OPHTHALMIC | Status: AC
  Administered 2021-01-15: 1 [drp] via OPHTHALMIC
  Filled 2021-01-15: qty 15

## 2021-01-15 MED ORDER — BSS PLUS IO SOLN
INTRAOCULAR | Status: AC
  Filled 2021-01-15: qty 500

## 2021-01-15 MED ORDER — FENTANYL CITRATE (PF) 100 MCG/2ML IJ SOLN
25.0000 ug | INTRAMUSCULAR | Status: DC | PRN
  Administered 2021-01-15 (×2): 25 ug via INTRAVENOUS

## 2021-01-15 MED ORDER — STERILE WATER FOR INJECTION IJ SOLN
INTRAMUSCULAR | Status: AC
  Filled 2021-01-15: qty 20

## 2021-01-15 MED ORDER — BUPIVACAINE HCL (PF) 0.75 % IJ SOLN
INTRAMUSCULAR | Status: DC | PRN
  Administered 2021-01-15: 10 mL

## 2021-01-15 MED ORDER — DEXAMETHASONE SODIUM PHOSPHATE 10 MG/ML IJ SOLN
INTRAMUSCULAR | Status: DC | PRN
  Administered 2021-01-15: 10 mg

## 2021-01-15 MED ORDER — CEFAZOLIN SODIUM-DEXTROSE 2-4 GM/100ML-% IV SOLN
2.0000 g | INTRAVENOUS | Status: AC
  Administered 2021-01-15: 2 g via INTRAVENOUS

## 2021-01-15 MED ORDER — MIDAZOLAM HCL 5 MG/5ML IJ SOLN
INTRAMUSCULAR | Status: DC | PRN
  Administered 2021-01-15: 1 mg via INTRAVENOUS

## 2021-01-15 MED ORDER — TEMAZEPAM 15 MG PO CAPS: 15.0000 mg | ORAL_CAPSULE | Freq: Every evening | ORAL | Status: DC | PRN

## 2021-01-15 MED ORDER — GATIFLOXACIN 0.5 % OP SOLN
OPHTHALMIC | Status: AC
  Administered 2021-01-15: 1 [drp] via OPHTHALMIC
  Filled 2021-01-15: qty 2.5

## 2021-01-15 MED ORDER — FENTANYL CITRATE (PF) 250 MCG/5ML IJ SOLN
INTRAMUSCULAR | Status: AC
  Filled 2021-01-15: qty 5

## 2021-01-15 MED ORDER — CYCLOPENTOLATE HCL 1 % OP SOLN
OPHTHALMIC | Status: AC
  Administered 2021-01-15: 1 [drp] via OPHTHALMIC
  Filled 2021-01-15: qty 2

## 2021-01-15 MED ORDER — ACETAMINOPHEN 325 MG PO TABS: 325.0000 mg | ORAL_TABLET | ORAL | Status: DC | PRN

## 2021-01-15 MED ORDER — SODIUM CHLORIDE (PF) 0.9 % IJ SOLN
INTRAMUSCULAR | Status: AC
  Filled 2021-01-15: qty 10

## 2021-01-15 MED ORDER — LATANOPROST 0.005 % OP SOLN
1.0000 [drp] | Freq: Every day | OPHTHALMIC | Status: DC
  Filled 2021-01-15: qty 2.5

## 2021-01-15 MED ORDER — BRIMONIDINE TARTRATE 0.15 % OP SOLN
OPHTHALMIC | Status: AC
  Administered 2021-01-15: 1 [drp] via OPHTHALMIC
  Filled 2021-01-15: qty 5

## 2021-01-15 MED ORDER — TROPICAMIDE 1 % OP SOLN: 1.0000 [drp] | OPHTHALMIC | Status: AC | PRN

## 2021-01-15 SURGICAL SUPPLY — 70 items
BALL CTTN LRG ABS STRL LF (GAUZE/BANDAGES/DRESSINGS) ×3
BAND WRIST GAS GREEN (MISCELLANEOUS) IMPLANT
BLADE EYE CATARACT 19 1.4 BEAV (BLADE) IMPLANT
BLADE MVR KNIFE 19G (BLADE) IMPLANT
BLADE MVR KNIFE 20G (BLADE) ×2 IMPLANT
CANNULA VLV SOFT TIP 25G (OPHTHALMIC) ×1 IMPLANT
CANNULA VLV SOFT TIP 25GA (OPHTHALMIC) ×2 IMPLANT
CORD BIPOLAR FORCEPS 12FT (ELECTRODE) ×2 IMPLANT
COTTONBALL LRG STERILE PKG (GAUZE/BANDAGES/DRESSINGS) ×6 IMPLANT
COVER MAYO STAND STRL (DRAPES) IMPLANT
COVER WAND RF STERILE (DRAPES) ×2 IMPLANT
DRAPE INCISE 51X51 W/FILM STRL (DRAPES) IMPLANT
DRAPE OPHTHALMIC 77X100 STRL (CUSTOM PROCEDURE TRAY) ×2 IMPLANT
ERASER HMR WETFIELD 23G BP (MISCELLANEOUS) ×2 IMPLANT
FILTER BLUE MILLIPORE (MISCELLANEOUS) IMPLANT
FILTER STRAW FLUID ASPIR (MISCELLANEOUS) ×2 IMPLANT
FORCEPS GRIESHABER ILM 25G A (INSTRUMENTS) IMPLANT
GAS AUTO FILL CONSTEL (OPHTHALMIC) ×2
GAS AUTO FILL CONSTELLATION (OPHTHALMIC) ×1 IMPLANT
GAS WRIST BAND GREEN (MISCELLANEOUS)
GLOVE SS BIOGEL STRL SZ 6.5 (GLOVE) ×1 IMPLANT
GLOVE SS BIOGEL STRL SZ 7 (GLOVE) ×1 IMPLANT
GLOVE SUPERSENSE BIOGEL SZ 6.5 (GLOVE) ×1
GLOVE SUPERSENSE BIOGEL SZ 7 (GLOVE) ×1
GLOVE SURG 8.5 LATEX PF (GLOVE) ×2 IMPLANT
GLOVE TRIUMPH SURG SIZE 8.5 (KITS) ×2 IMPLANT
GOWN STRL REUS W/ TWL LRG LVL3 (GOWN DISPOSABLE) ×3 IMPLANT
GOWN STRL REUS W/TWL LRG LVL3 (GOWN DISPOSABLE) ×6
HANDLE PNEUMATIC FOR CONSTEL (OPHTHALMIC) IMPLANT
KIT BASIN OR (CUSTOM PROCEDURE TRAY) ×2 IMPLANT
KNIFE GRIESHABER SHARP 2.5MM (MISCELLANEOUS) IMPLANT
MICROPICK 25G (MISCELLANEOUS)
NDL 18GX1X1/2 (RX/OR ONLY) (NEEDLE) ×1 IMPLANT
NDL 25GX 5/8IN NON SAFETY (NEEDLE) ×1 IMPLANT
NDL FILTER BLUNT 18X1 1/2 (NEEDLE) IMPLANT
NDL HYPO 30X.5 LL (NEEDLE) IMPLANT
NEEDLE 18GX1X1/2 (RX/OR ONLY) (NEEDLE) ×2 IMPLANT
NEEDLE 25GX 5/8IN NON SAFETY (NEEDLE) ×2 IMPLANT
NEEDLE FILTER BLUNT 18X 1/2SAF (NEEDLE)
NEEDLE FILTER BLUNT 18X1 1/2 (NEEDLE) IMPLANT
NEEDLE HYPO 30X.5 LL (NEEDLE) IMPLANT
NS IRRIG 1000ML POUR BTL (IV SOLUTION) ×2 IMPLANT
PACK FRAGMATOME (OPHTHALMIC) IMPLANT
PACK VITRECTOMY CUSTOM (CUSTOM PROCEDURE TRAY) ×2 IMPLANT
PAD ARMBOARD 7.5X6 YLW CONV (MISCELLANEOUS) ×4 IMPLANT
PAK PIK VITRECTOMY CVS 25GA (OPHTHALMIC) ×2 IMPLANT
PIC ILLUMINATED 25G (OPHTHALMIC) ×2
PICK MICROPICK 25G (MISCELLANEOUS) IMPLANT
PIK ILLUMINATED 25G (OPHTHALMIC) ×1 IMPLANT
PROBE LASER ILLUM FLEX CVD 25G (OPHTHALMIC) IMPLANT
REPL STRA BRUSH NDL (NEEDLE) ×1 IMPLANT
REPL STRA BRUSH NEEDLE (NEEDLE) ×2 IMPLANT
RESERVOIR BACK FLUSH (MISCELLANEOUS) ×2 IMPLANT
ROLLS DENTAL (MISCELLANEOUS) ×4 IMPLANT
SCRAPER DIAMOND 25GA (OPHTHALMIC RELATED) IMPLANT
SCRAPER DIAMOND DUST MEMBRANE (MISCELLANEOUS) ×2 IMPLANT
SPONGE SURGIFOAM ABS GEL 12-7 (HEMOSTASIS) ×2 IMPLANT
STOPCOCK 4 WAY LG BORE MALE ST (IV SETS) IMPLANT
SUT CHROMIC 7 0 TG140 8 (SUTURE) IMPLANT
SUT ETHILON 9 0 TG140 8 (SUTURE) ×2 IMPLANT
SUT POLY NON ABSORB 10-0 8 STR (SUTURE) IMPLANT
SUT SILK 4 0 RB 1 (SUTURE) IMPLANT
SYR 10ML LL (SYRINGE) IMPLANT
SYR 20ML LL LF (SYRINGE) ×2 IMPLANT
SYR 5ML LL (SYRINGE) IMPLANT
SYR BULB EAR ULCER 3OZ GRN STR (SYRINGE) ×2 IMPLANT
SYR TB 1ML LUER SLIP (SYRINGE) ×2 IMPLANT
TUBING HIGH PRESS EXTEN 6IN (TUBING) IMPLANT
WATER STERILE IRR 1000ML POUR (IV SOLUTION) ×2 IMPLANT
WIPE INSTRUMENT VISIWIPE 73X73 (MISCELLANEOUS) IMPLANT

## 2021-01-15 NOTE — Transfer of Care (Signed)
Immediate Anesthesia Transfer of Care Note  Patient: Amy Blackwell  Procedure(s) Performed: 25 GAUGE PARS PLANA VITRECTOMY WITH 20 GAUGE MVR PORT FOR MACULAR HOLE (Right Eye)  Patient Location: PACU  Anesthesia Type:General  Level of Consciousness: awake, alert  and oriented  Airway & Oxygen Therapy: Patient Spontanous Breathing  Post-op Assessment: Report given to RN, Post -op Vital signs reviewed and stable and Patient moving all extremities  Post vital signs: Reviewed and stable  Last Vitals:  Vitals Value Taken Time  BP 104/48 01/15/21 1315  Temp 36.1 C 01/15/21 1315  Pulse 70 01/15/21 1318  Resp 15 01/15/21 1319  SpO2 90 % 01/15/21 1318  Vitals shown include unvalidated device data.  Last Pain:  Vitals:   01/15/21 1315  TempSrc:   PainSc: 4          Complications: No complications documented.

## 2021-01-15 NOTE — H&P (Signed)
I examined the patient today and there is no change in the medical status 

## 2021-01-15 NOTE — Anesthesia Procedure Notes (Signed)
Procedure Name: Intubation Date/Time: 01/15/2021 11:51 AM Performed by: Amadeo Garnet, CRNA Pre-anesthesia Checklist: Patient identified, Emergency Drugs available, Suction available and Patient being monitored Patient Re-evaluated:Patient Re-evaluated prior to induction Oxygen Delivery Method: Circle system utilized Preoxygenation: Pre-oxygenation with 100% oxygen Induction Type: IV induction Ventilation: Mask ventilation without difficulty and Oral airway inserted - appropriate to patient size Laryngoscope Size: Mac and 3 Tube type: Oral Tube size: 7.0 mm Number of attempts: 1 Airway Equipment and Method: Stylet and Oral airway Placement Confirmation: ETT inserted through vocal cords under direct vision,  positive ETCO2 and breath sounds checked- equal and bilateral Secured at: 21 cm Tube secured with: Tape Dental Injury: Teeth and Oropharynx as per pre-operative assessment

## 2021-01-15 NOTE — Anesthesia Preprocedure Evaluation (Signed)
Anesthesia Evaluation  Patient identified by MRN, date of birth, ID band Patient awake    Reviewed: Allergy & Precautions, NPO status , Patient's Chart, lab work & pertinent test results  Airway Mallampati: II       Dental  (+) Edentulous Upper, Edentulous Lower   Pulmonary asthma , former smoker,    Pulmonary exam normal        Cardiovascular Normal cardiovascular exam     Neuro/Psych    GI/Hepatic negative GI ROS, Neg liver ROS,   Endo/Other  Hypothyroidism Morbid obesity  Renal/GU negative Renal ROS     Musculoskeletal   Abdominal (+) + obese,   Peds  Hematology   Anesthesia Other Findings   Reproductive/Obstetrics                             Anesthesia Physical Anesthesia Plan  ASA: III  Anesthesia Plan: General   Post-op Pain Management:    Induction: Intravenous  PONV Risk Score and Plan: Ondansetron  Airway Management Planned: Oral ETT  Additional Equipment: None  Intra-op Plan:   Post-operative Plan: Extubation in OR  Informed Consent: I have reviewed the patients History and Physical, chart, labs and discussed the procedure including the risks, benefits and alternatives for the proposed anesthesia with the patient or authorized representative who has indicated his/her understanding and acceptance.     Dental advisory given  Plan Discussed with: CRNA  Anesthesia Plan Comments:         Anesthesia Quick Evaluation

## 2021-01-15 NOTE — Brief Op Note (Signed)
Brief Operative note   Preoperative diagnosis:  macular hole right eye Postoperative diagnosis same  Procedures: Pars plana vitrectomy, membrane peel, internal limiting membrane peel. Laser, C3F8 injection,serum patch right eye  Surgeon:  Hayden Pedro, MD...  Assistant:  Deatra Ina SA    Anesthesia: General  Specimen: none  Estimated blood loss:  1cc  Complications: none  Patient sent to PACU in good condition  Composed by Hayden Pedro MD  Dictation number: [id number]

## 2021-01-16 ENCOUNTER — Encounter (HOSPITAL_COMMUNITY): Payer: Self-pay | Admitting: Ophthalmology

## 2021-01-16 DIAGNOSIS — H35341 Macular cyst, hole, or pseudohole, right eye: Secondary | ICD-10-CM | POA: Diagnosis not present

## 2021-01-16 MED ORDER — STERILE WATER FOR INJECTION IJ SOLN
INTRAMUSCULAR | Status: AC
  Administered 2021-01-16: 5 mL
  Filled 2021-01-16: qty 10

## 2021-01-16 MED ORDER — DORZOLAMIDE HCL 2 % OP SOLN: 1.0000 [drp] | Freq: Two times a day (BID) | OPHTHALMIC | 12 refills | Status: DC

## 2021-01-16 MED ORDER — GATIFLOXACIN 0.5 % OP SOLN: 1.0000 [drp] | Freq: Four times a day (QID) | OPHTHALMIC | Status: DC

## 2021-01-16 MED ORDER — BRIMONIDINE TARTRATE 0.2 % OP SOLN: 1.0000 [drp] | Freq: Two times a day (BID) | OPHTHALMIC | 12 refills | Status: DC

## 2021-01-16 MED ORDER — PREDNISOLONE ACETATE 1 % OP SUSP: 1.0000 [drp] | Freq: Four times a day (QID) | OPHTHALMIC | 0 refills | Status: DC

## 2021-01-16 MED ORDER — BACITRACIN-POLYMYXIN B 500-10000 UNIT/GM OP OINT: 1.0000 "application " | TOPICAL_OINTMENT | Freq: Three times a day (TID) | OPHTHALMIC | 0 refills | Status: DC

## 2021-01-16 NOTE — Progress Notes (Signed)
Discharge instructions given to patient. Patient verbalizes understanding. Medications given to patient. Collected patients belongings. Patient discharged.

## 2021-01-16 NOTE — Progress Notes (Signed)
01/16/2021, 6:38 AM  Mental Status:  Awake, Alert, Oriented  Anterior segment: Cornea  Clear    Anterior Chamber Clear    Lens:   Cataract  Intra Ocular Pressure 28 mmHg with Tonopen  Vitreous: Clear 95%gas bubble   Retina:  Attached Good laser reaction   Impression: Excellent result Retina attached   Final Diagnosis: Principal Problem:   Macular hole, right eye   Plan: start post operative eye drops.  Add Brimonidine and Cosopt OD.  Discharge to home.  Give post operative instructions  Amy Blackwell 01/16/2021, 6:38 AM

## 2021-01-16 NOTE — Anesthesia Postprocedure Evaluation (Signed)
Anesthesia Post Note  Patient: Amy Blackwell  Procedure(s) Performed: 25 GAUGE PARS PLANA VITRECTOMY WITH 20 GAUGE MVR PORT FOR MACULAR HOLE (Right Eye)     Patient location during evaluation: PACU Anesthesia Type: General Level of consciousness: awake Pain management: pain level controlled Vital Signs Assessment: post-procedure vital signs reviewed and stable Respiratory status: spontaneous breathing Cardiovascular status: stable Postop Assessment: no apparent nausea or vomiting Anesthetic complications: no   No complications documented.  Last Vitals:  Vitals:   01/16/21 0241 01/16/21 0601  BP: (!) 157/89 135/90  Pulse: 63 63  Resp: 17 20  Temp: 36.9 C 36.6 C  SpO2: 96% 98%    Last Pain:  Vitals:   01/16/21 0800  TempSrc:   PainSc: 0-No pain                 Huston Foley

## 2021-01-16 NOTE — Discharge Summary (Signed)
Discharge summary not needed on [facility] patients per medical records. 

## 2021-01-16 NOTE — Op Note (Signed)
NAME: Amy Blackwell, Amy Blackwell MEDICAL RECORD NO: [id number] ACCOUNT NO: [id number] DATE OF BIRTH: 10/22/1950 FACILITY: [facility] LOCATION: [facility] PHYSICIAN: Chrystie Nose. Zigmund Daniel, MD  Operative Report   DATE OF PROCEDURE: 01/15/2021  ADMISSION DIAGNOSIS:  Macular hole, right eye.  PROCEDURE:  Pars plana vitrectomy, retinal photocoagulation, membrane peel, internal limiting membrane peel, serum patch, gas fluid exchange, all in the right eye for macular hole repair.  SURGEON:  Chrystie Nose. Zigmund Daniel, MD  ASSISTANT:  Deatra Ina, SA.  ANESTHESIA:  General.  DESCRIPTION OF PROCEDURE:  Usual prep and drape.  The indirect ophthalmoscope, the laser was moved into place, 812 burns were placed around the retinal periphery.  In weak spots of the retina the power was 360 milliwatts, 1000 microns each and 0.1  seconds each.  Attention was then carried to the globe and the pars plana area.  25 gauge trocars were placed at 8 and 10 o'clock.  A conjunctival incision was made at 2 o'clock down to the sclera with diathermy for hemostasis.  A diamond knife was used  to create a frown incision and a small pocket of sclera.  The MVR 20 gauge was used to enter the vitreous cavity through the scleral pocket.  The contact lens ring was anchored into place at 6 and 12 o'clock with a 7-0 Vicryl.  Provisc was placed on the  corneal surface and the flat contact lens was placed.  Pars plana vitrectomy was begun just behind these crystalline lens.  The vitrectomy was carried in a core fashion down to the macular area.  Large white membranes were encountered and removed under  low suction and rapid cutting.  Once the core vitrectomy was completed, the silicone tipped suction line was drawn into the eye and down towards the macular hole.  The fish strike sign occurred and the lighted pick was used to engage posterior membranes  and cause a complete posterior vitreous detachment.  The edges of the hole lifted as the vitreous was extracted from  the edges of the hole.  This PVD was then brought into the midvitreous and removed with the vitreous cutter.  A 30 degree prismatic lens  was moved into place on to the corneal surface.  Vitreous was removed from the mid periphery for 360 degrees with a lighted pick and the cutter, the vitrectomy was then carried into the far periphery and additional vitreous was removed.  The periphery  was inspected and there were no breaks.  Attention was carried to the macula and the magnifying contact lens was placed.  The diamond dusted membrane scraper was drawn around the macular hole and the internal limiting membrane fragments were seen.  These  were carefully removed.  The edges of the hole were drawn together with strokes of the membrane peeler until the edges came together, some tiny hemorrhages occurred around the edges of the hole.  A gas fluid exchange was then carried out and all gas was  removed.  Sufficient time was allowed to pass, so that additional fluid tracked on the walls of the eye and collected in the posterior segment.  The serum patch was prepared during this time, and C3F8 was prepared to a 14% concentration.  Silicone  tipped suction line was used to remove all additional fluid from the posterior segment.  The double wide contact lens was used for this maneuver.  The serum patch was delivered.  Excess serum was removed with a silicone tipped suction line.  C3F8 14% was  exchanged for  intravitreal gas.  The instruments were removed from the eye.  9-0 nylon was used to close the sclerotomy site at 2 o'clock.  The conjunctiva was closed with wet field cautery.  25 gauge trocars were removed.  The wounds were tested and  found to be secure.  Polymyxin and ceftazidime were rinsed around the globe for antibiotic coverage.  Decadron 10 mg was injected into the lower subconjunctival space.  Atropine solution was applied.  Marcaine was injected around the globe for  postoperative pain.  Polysporin  ophthalmic ointment, a patch and a shield were placed.  Closing pressure was 10 with a Barraquer tonometer.  The patient was awakened and taken to recovery in satisfactory condition.   PUS D: 01/15/2021 1:09:25 pm T: 01/16/2021 7:27:00 pm  JOB: [id number]/ [id number]

## 2021-01-22 ENCOUNTER — Encounter (INDEPENDENT_AMBULATORY_CARE_PROVIDER_SITE_OTHER): Payer: PPO | Admitting: Ophthalmology

## 2021-01-22 ENCOUNTER — Other Ambulatory Visit: Payer: Self-pay

## 2021-01-22 DIAGNOSIS — H35341 Macular cyst, hole, or pseudohole, right eye: Secondary | ICD-10-CM

## 2021-02-11 ENCOUNTER — Other Ambulatory Visit: Payer: Self-pay

## 2021-02-11 ENCOUNTER — Encounter (INDEPENDENT_AMBULATORY_CARE_PROVIDER_SITE_OTHER): Payer: PPO | Admitting: Ophthalmology

## 2021-02-11 DIAGNOSIS — H35341 Macular cyst, hole, or pseudohole, right eye: Secondary | ICD-10-CM

## 2021-03-03 IMAGING — US ULTRASOUND ABDOMEN COMPLETE
1 series · 14 of 25 positions shown · non-contrast
Comparison: None.

CLINICAL DATA: Follow-up NASH

EXAM:
ABDOMEN ULTRASOUND COMPLETE

[Series 1: ultrasound abdomen complete · 0.19mm/px · 14 of 93 slices shown]
[im 1/93]
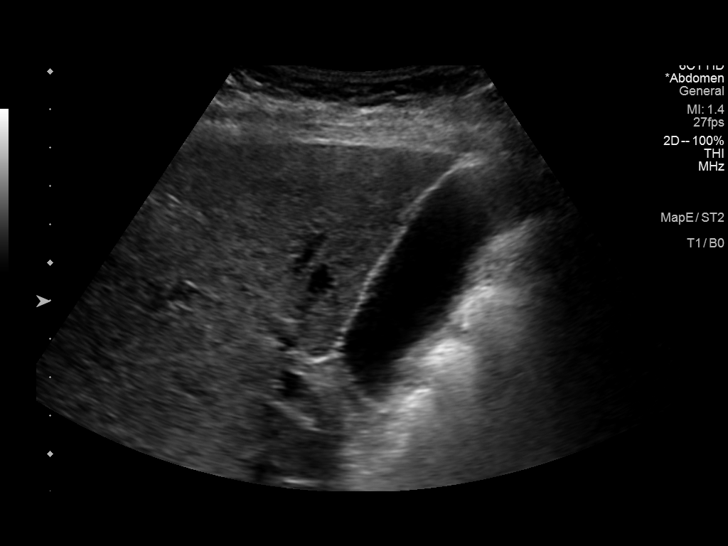
[im 8/93]
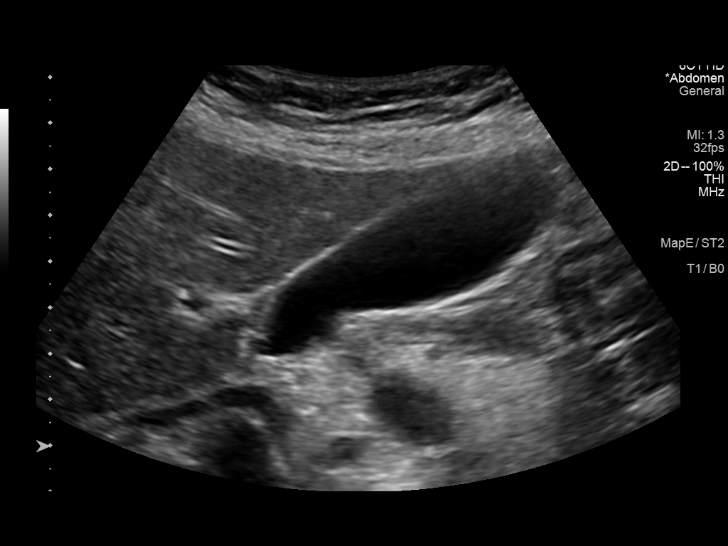
[im 16/93]
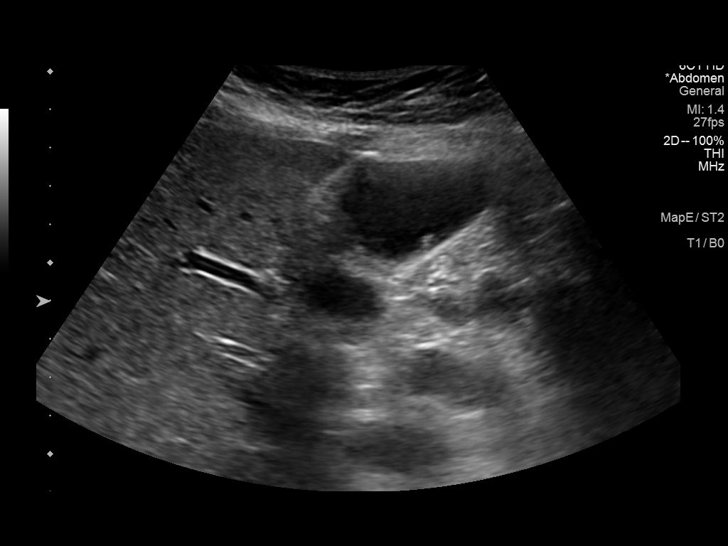
[im 24/93]
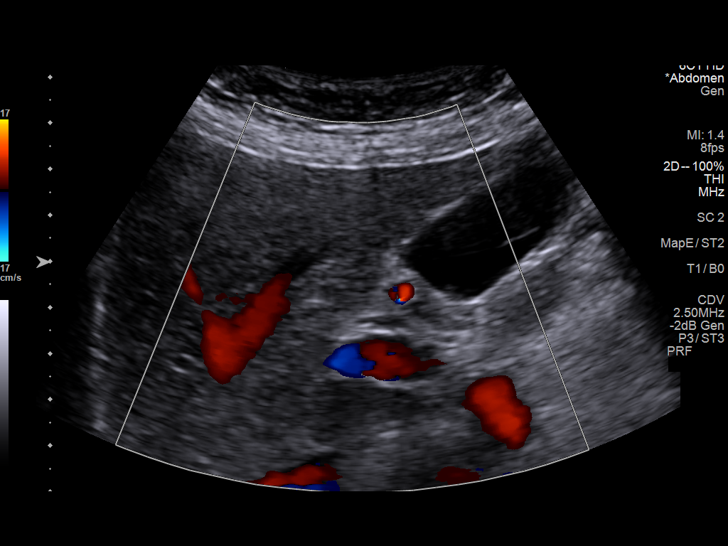
[im 31/93]
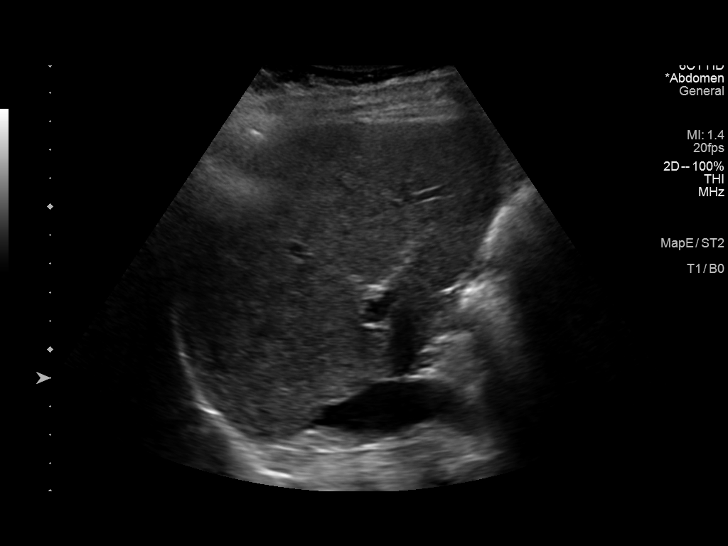
[im 35/93]
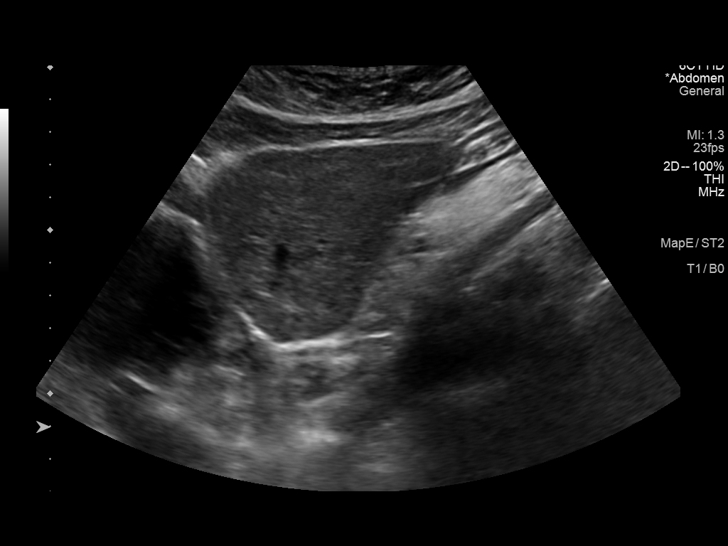
[im 43/93]
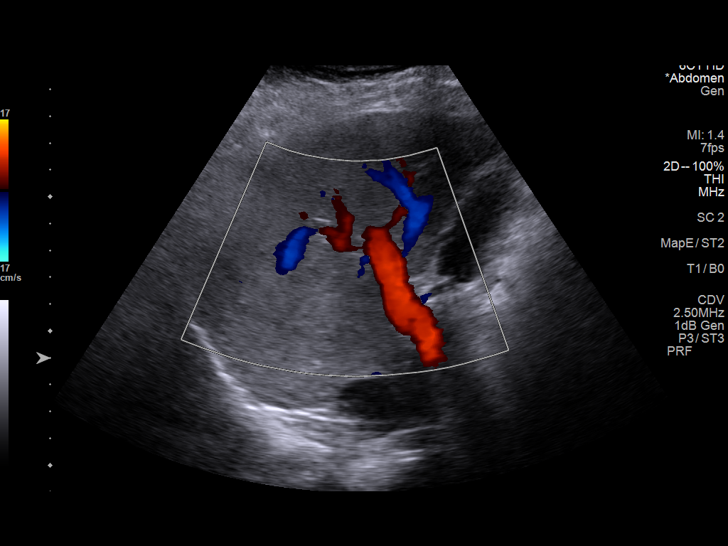
[im 50/93]
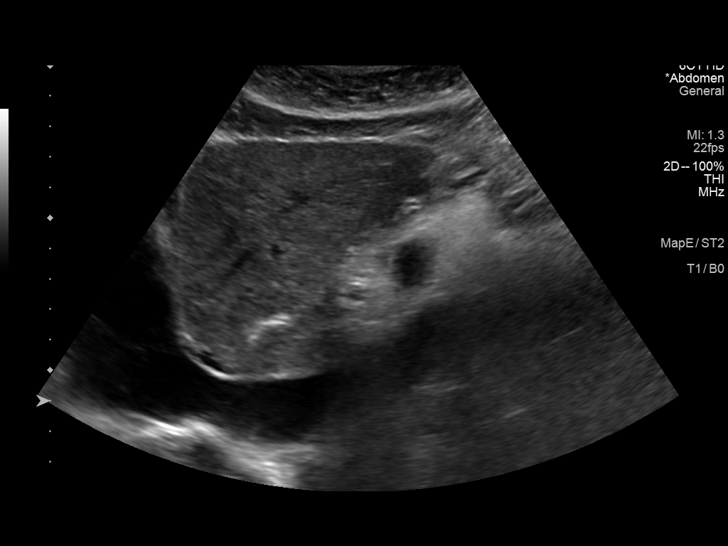
[im 58/93]
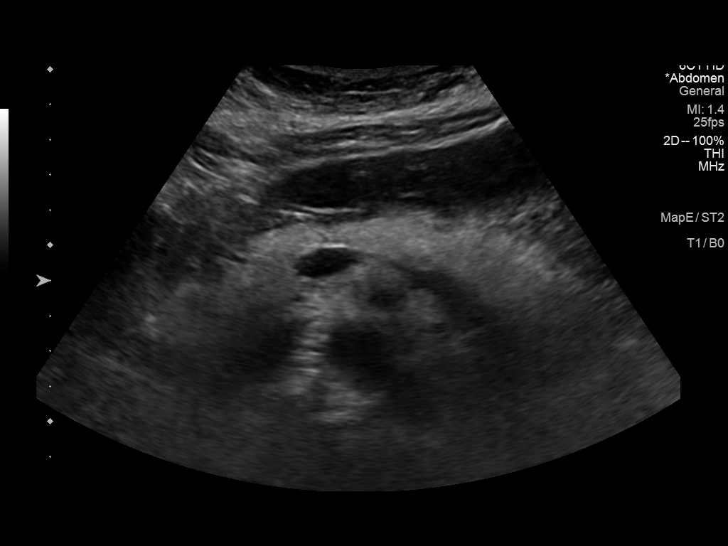
[im 62/93]
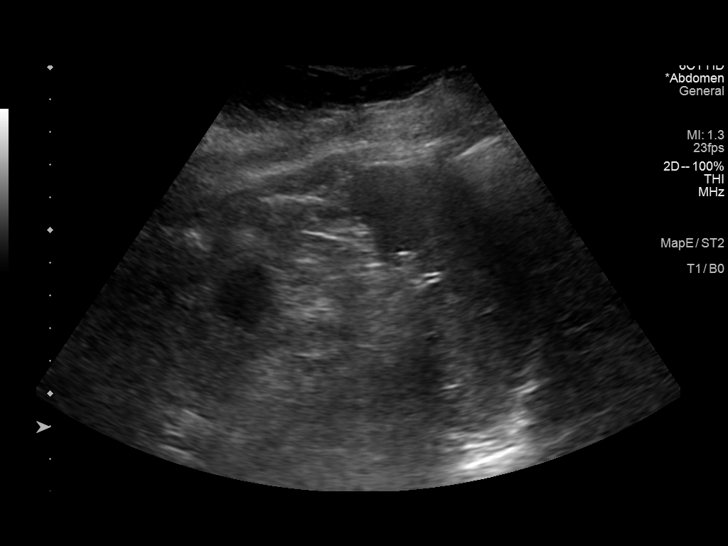
[im 70/93]
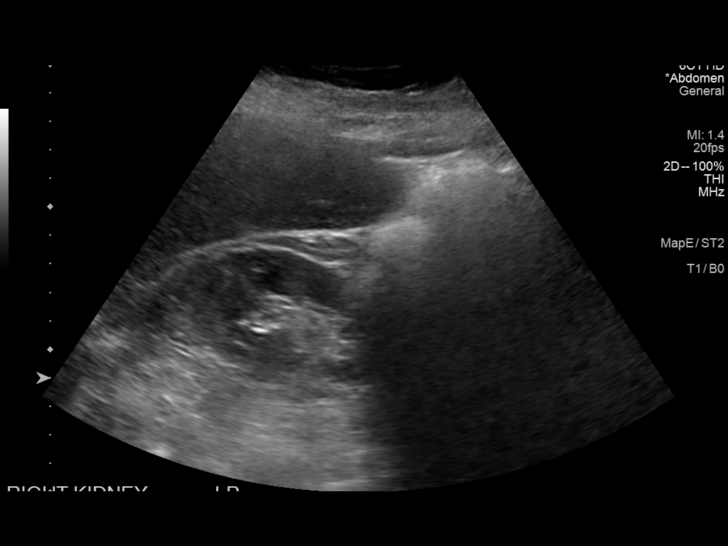
[im 77/93]
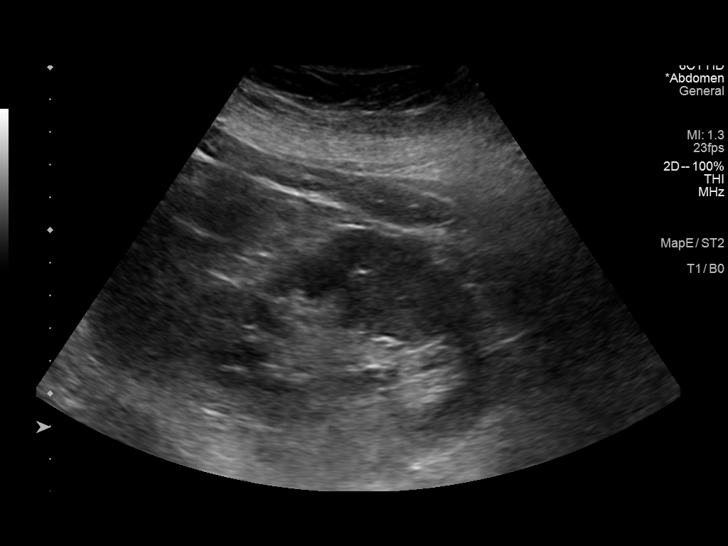
[im 85/93]
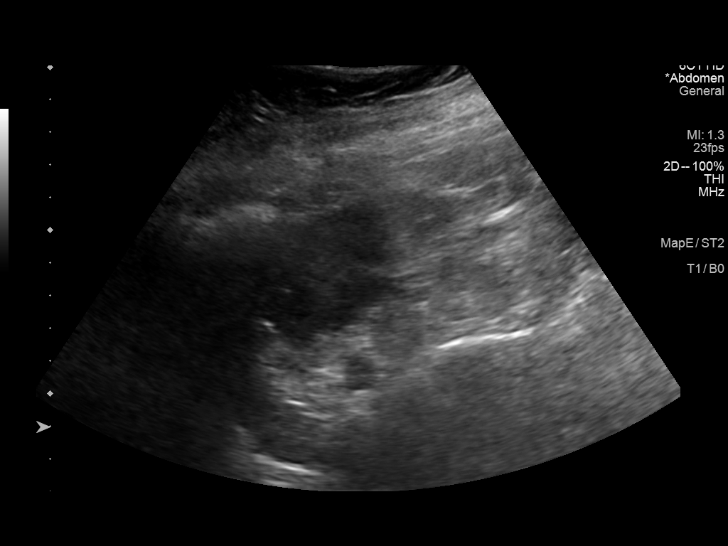
[im 93/93]
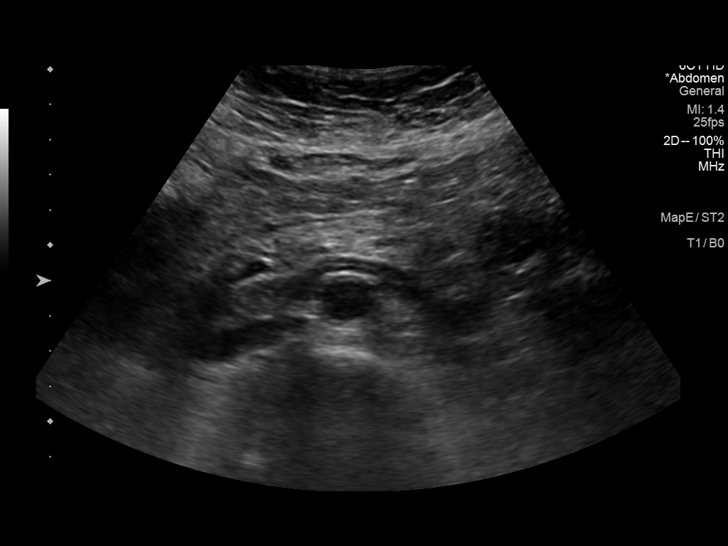

[14 of 25 positions shown; findings below may reference images not displayed]

FINDINGS: Gallbladder: Stable small gallbladder wall polyps. No gallstones
seen. No gallbladder wall thickening or pericholecystic fluid.

Common bile duct: Diameter: 3 mm

Liver: No focal lesion identified. Within normal limits in
parenchymal echogenicity. Portal vein is patent on color Doppler
imaging with normal direction of blood flow towards the liver.

IVC: No abnormality visualized.

Pancreas: Visualized portion unremarkable.

Spleen: Size and appearance within normal limits.

Right Kidney: Length: 10.1 cm. Echogenicity within normal limits. No
mass or hydronephrosis visualized.

Left Kidney: Length: 9.6 cm. Echogenicity within normal limits. No
mass or hydronephrosis visualized.

Abdominal aorta: No aneurysm visualized.

Other findings: None.
IMPRESSION: 1. No acute findings.
2. On today's study, liver is unremarkable with no convincing
evidence of fatty infiltration.
3. Small gallbladder wall polyps.

## 2021-04-19 ENCOUNTER — Encounter (INDEPENDENT_AMBULATORY_CARE_PROVIDER_SITE_OTHER): Payer: PPO | Admitting: Ophthalmology

## 2021-04-19 ENCOUNTER — Other Ambulatory Visit: Payer: Self-pay

## 2021-04-19 DIAGNOSIS — H43812 Vitreous degeneration, left eye: Secondary | ICD-10-CM

## 2021-04-19 DIAGNOSIS — H2513 Age-related nuclear cataract, bilateral: Secondary | ICD-10-CM | POA: Diagnosis not present

## 2021-04-19 DIAGNOSIS — H35341 Macular cyst, hole, or pseudohole, right eye: Secondary | ICD-10-CM | POA: Diagnosis not present

## 2021-06-10 DIAGNOSIS — H2513 Age-related nuclear cataract, bilateral: Secondary | ICD-10-CM | POA: Diagnosis not present

## 2021-06-10 DIAGNOSIS — H5010 Unspecified exotropia: Secondary | ICD-10-CM | POA: Diagnosis not present

## 2021-06-10 DIAGNOSIS — H52203 Unspecified astigmatism, bilateral: Secondary | ICD-10-CM | POA: Diagnosis not present

## 2021-08-06 DIAGNOSIS — Z01419 Encounter for gynecological examination (general) (routine) without abnormal findings: Secondary | ICD-10-CM | POA: Diagnosis not present

## 2021-08-06 DIAGNOSIS — Z1231 Encounter for screening mammogram for malignant neoplasm of breast: Secondary | ICD-10-CM | POA: Diagnosis not present

## 2021-08-06 DIAGNOSIS — N3941 Urge incontinence: Secondary | ICD-10-CM | POA: Diagnosis not present

## 2021-08-06 DIAGNOSIS — Z01411 Encounter for gynecological examination (general) (routine) with abnormal findings: Secondary | ICD-10-CM | POA: Diagnosis not present

## 2021-08-06 DIAGNOSIS — Z6839 Body mass index (BMI) 39.0-39.9, adult: Secondary | ICD-10-CM | POA: Diagnosis not present

## 2021-08-06 DIAGNOSIS — Z124 Encounter for screening for malignant neoplasm of cervix: Secondary | ICD-10-CM | POA: Diagnosis not present

## 2021-08-06 DIAGNOSIS — B372 Candidiasis of skin and nail: Secondary | ICD-10-CM | POA: Diagnosis not present

## 2021-09-23 ENCOUNTER — Encounter (INDEPENDENT_AMBULATORY_CARE_PROVIDER_SITE_OTHER): Payer: PPO | Admitting: Ophthalmology

## 2021-09-24 ENCOUNTER — Encounter (INDEPENDENT_AMBULATORY_CARE_PROVIDER_SITE_OTHER): Payer: PPO | Admitting: Ophthalmology

## 2021-09-24 ENCOUNTER — Other Ambulatory Visit: Payer: Self-pay

## 2021-09-24 DIAGNOSIS — H35341 Macular cyst, hole, or pseudohole, right eye: Secondary | ICD-10-CM

## 2021-09-24 DIAGNOSIS — H2513 Age-related nuclear cataract, bilateral: Secondary | ICD-10-CM

## 2021-09-24 DIAGNOSIS — H43812 Vitreous degeneration, left eye: Secondary | ICD-10-CM | POA: Diagnosis not present

## 2022-04-09 ENCOUNTER — Encounter (INDEPENDENT_AMBULATORY_CARE_PROVIDER_SITE_OTHER): Payer: Self-pay

## 2022-04-15 ENCOUNTER — Other Ambulatory Visit: Payer: Self-pay | Admitting: Home Modifications

## 2022-04-15 DIAGNOSIS — E039 Hypothyroidism, unspecified: Secondary | ICD-10-CM

## 2022-04-16 ENCOUNTER — Ambulatory Visit
Admission: RE | Admit: 2022-04-16 | Discharge: 2022-04-16 | Disposition: A | Discharge status: Home / Self Care | Payer: PPO | Source: Ambulatory Visit | Attending: Home Modifications | Admitting: Home Modifications

## 2022-04-16 DIAGNOSIS — E039 Hypothyroidism, unspecified: Secondary | ICD-10-CM

## 2023-03-26 ENCOUNTER — Emergency Department (HOSPITAL_COMMUNITY): Payer: PPO

## 2023-03-26 ENCOUNTER — Encounter (HOSPITAL_COMMUNITY): Payer: Self-pay

## 2023-03-26 ENCOUNTER — Other Ambulatory Visit: Payer: Self-pay

## 2023-03-26 ENCOUNTER — Emergency Department (HOSPITAL_COMMUNITY)
Admission: EM | Admit: 2023-03-26 | Discharge: 2023-03-27 | Disposition: A | Discharge status: Home / Self Care | Payer: PPO | Attending: Emergency Medicine | Admitting: Emergency Medicine

## 2023-03-26 DIAGNOSIS — R002 Palpitations: Secondary | ICD-10-CM | POA: Diagnosis present

## 2023-03-26 DIAGNOSIS — R0789 Other chest pain: Secondary | ICD-10-CM | POA: Diagnosis not present

## 2023-03-26 LAB — BASIC METABOLIC PANEL
Anion gap: 10 (ref 5–15)
BUN: 21 mg/dL (ref 8–23)
CO2: 27 mmol/L (ref 22–32)
Calcium: 9.4 mg/dL (ref 8.9–10.3)
Chloride: 105 mmol/L (ref 98–111)
Creatinine, Ser: 0.92 mg/dL (ref 0.44–1.00)
GFR, Estimated: >60 mL/min (ref >60)
Glucose, Bld: 110 mg/dL — ABNORMAL HIGH (ref 70–99)
Potassium: 4.1 mmol/L (ref 3.5–5.1)
Sodium: 142 mmol/L (ref 135–145)

## 2023-03-26 LAB — CBC
HCT: 40.1 % (ref 36.0–46.0)
Hemoglobin: 12.7 g/dL (ref 12.0–15.0)
MCH: 28.5 pg (ref 26.0–34.0)
MCHC: 31.7 g/dL (ref 30.0–36.0)
MCV: 89.9 fL (ref 80.0–100.0)
Platelets: 268 10*3/uL (ref 150–400)
RBC: 4.46 MIL/uL (ref 3.87–5.11)
RDW: 14.4 % (ref 11.5–15.5)
WBC: 7.7 10*3/uL (ref 4.0–10.5)
nRBC: 0 % (ref 0.0–0.2)

## 2023-03-26 LAB — TROPONIN I (HIGH SENSITIVITY): Troponin I (High Sensitivity): 5 ng/L (ref <18)

## 2023-03-26 NOTE — ED Triage Notes (Addendum)
Says she has felt strange today so she went to UC and was evaluated. They completed an EKG and was told it is Afib but she says she has no history of.   Denies chest pain when asked but says her left chest feels "full and fluttery". Also says she has had several episodes of presyncope.   Set in for evaluation.   Denies any anticoagulants.

## 2023-03-26 NOTE — ED Provider Notes (Signed)
[place] EMERGENCY DEPARTMENT AT [facility] Provider Note   CSN: [id number] Arrival date & time: 03/26/23  2048     History  Chief Complaint  Patient presents with   Chest Pain    Amy Blackwell is a 72 y.o. female.  The history is provided by the patient and medical records.  Chest Pain Amy Blackwell is a 72 y.o. female who presents to the Emergency Department complaining of palpitations.  She presents to the ED for palpitations and chest discomfort.  She has experienced brief episodes in the mornings for the last week, but today she has experienced episodes through most of the day.  Also feels unwell with these episodes.  She checked her rhythm on her husband's kardia mobile app and she received "undetermined rhythm" and "possible afib" results. She went to the urgent care in [place] and was referred to the ED for further evaluation.  She is asymptomatic at time of ED evaluation.  No sob but feels like she needs to take a deep breath during episodes.  No fever, n/v/d.  She did start a new reflux med one week ago.  She also recently saw pcp and had her thyroid level checked, but is not sure of the results.    No alcohol use.      Home Medications Prior to Admission medications   Medication Sig Start Date End Date Taking? Authorizing Provider  albuterol (VENTOLIN HFA) 108 (90 Base) MCG/ACT inhaler Inhale 2 puffs into the lungs every 6 (six) hours as needed for wheezing or shortness of breath.    [provider]  bacitracin-polymyxin b (POLYSPORIN) ophthalmic ointment Place 1 application into the right eye 3 (three) times daily. apply to eye every 12 hours while awake 01/16/21   Sherrie George, MD  Bioflavonoid Products (SUPER-C 1000 PO) Take 1,000 mg by mouth daily.    [provider]  brimonidine (ALPHAGAN) 0.2 % ophthalmic solution Place 1 drop into the right eye 2 (two) times daily. 01/16/21   Sherrie George, MD  Coenzyme Q10 300 MG CAPS Take 300  mg by mouth daily.    [provider]  dorzolamide (TRUSOPT) 2 % ophthalmic solution Place 1 drop into the right eye 2 times daily at 12 noon and 4 pm. 01/16/21   Sherrie George, MD  gatifloxacin (ZYMAXID) 0.5 % SOLN Place 1 drop into the right eye 4 (four) times daily. 01/16/21   Sherrie George, MD  ibuprofen (ADVIL) 200 MG tablet Take 200 mg by mouth every 6 (six) hours as needed (arthritis).    [provider]  lactobacillus acidophilus (BACID) TABS tablet Take 1 tablet by mouth daily.    [provider]  levothyroxine (SYNTHROID) 88 MCG tablet Take 88 mcg by mouth daily before breakfast.    [provider]  magnesium oxide (MAG-OX) 400 MG tablet Take 400 mg by mouth daily.    [provider]  melatonin 5 MG TABS Take 5 tablets by mouth at bedtime.    [provider]  meloxicam (MOBIC) 15 MG tablet Take 15 mg by mouth daily as needed (Arthritis).    [provider]  Omega-3 Fatty Acids (SUPER OMEGA 3 EPA/DHA PO) Take 1 capsule by mouth daily.    [provider]  prednisoLONE acetate (PRED FORTE) 1 % ophthalmic suspension Place 1 drop into the right eye 4 (four) times daily. 01/16/21   Sherrie George, MD  rosuvastatin (CRESTOR) 20 MG tablet Take  20 mg by mouth daily. 04/30/20   [provider]  Vitamin D, Ergocalciferol, (DRISDOL) 1.25 MG (50000 UNIT) CAPS capsule Take 1 capsule (50,000 Units total) by mouth every 7 (seven) days. 11/05/20   Danford, Orpha Bur D, NP  vitamin E 180 MG (400 UNITS) capsule Take 400 Units by mouth daily.    [provider]      Allergies    Codeine, Shellfish allergy, and Tetanus toxoids    Review of Systems   Review of Systems  Cardiovascular:  Positive for chest pain.  All other systems reviewed and are negative.   Physical Exam Updated Vital Signs BP 124/71   Pulse 68   Temp 98.1 F (36.7 C) (Oral)   Resp 16   Ht 5\' 1"  (1.549 m)   Wt 95.3 kg   SpO2 95%   BMI 39.68  kg/m  Physical Exam Vitals and nursing note reviewed.  Constitutional:      Appearance: She is well-developed.  HENT:     Head: Normocephalic and atraumatic.  Cardiovascular:     Rate and Rhythm: Normal rate and regular rhythm.     Heart sounds: No murmur heard. Pulmonary:     Effort: Pulmonary effort is normal. No respiratory distress.     Breath sounds: Normal breath sounds.  Abdominal:     Palpations: Abdomen is soft.     Tenderness: There is no abdominal tenderness. There is no guarding or rebound.  Musculoskeletal:        General: No tenderness.  Skin:    General: Skin is warm and dry.  Neurological:     Mental Status: She is alert and oriented to person, place, and time.     Comments: No asymmetry of facial movements.  Visual fields grossly intact.  5/5 strength in all four extremities with sensation to light touch intact in all four extremities.   Psychiatric:        Behavior: Behavior normal.     ED Results / Procedures / Treatments   Labs (all labs ordered are listed, but only abnormal results are displayed) Labs Reviewed  BASIC METABOLIC PANEL - Abnormal; Notable for the following components:      Result Value   Glucose, Bld 110 (*)    All other components within normal limits  CBC  MAGNESIUM  TROPONIN I (HIGH SENSITIVITY)  TROPONIN I (HIGH SENSITIVITY)    EKG EKG Interpretation Date/Time:  Thursday March 26 2023 23:06:54 EDT Ventricular Rate:  61 PR Interval:  166 QRS Duration:  150 QT Interval:  456 QTC Calculation: 460 R Axis:   -35  Text Interpretation: Sinus rhythm Atrial premature complex Right bundle branch block Confirmed by Tilden Fossa [phone]) on 03/26/2023 11:16:51 PM  Radiology DG Chest 2 View  Result Date: 03/26/2023 CLINICAL DATA:  Chest pain EXAM: CHEST - 2 VIEW COMPARISON:  None Available. FINDINGS: Normal cardiomediastinal silhouette. Aortic atherosclerotic calcification. Linear atelectasis/scarring in the left mid lung. The lungs  are otherwise clear. No displaced rib fractures. IMPRESSION: No active cardiopulmonary disease. Electronically Signed   By: Minerva Fester M.D.   On: 03/26/2023 22:15    Procedures Procedures    Medications Ordered in ED Medications - No data to display  ED Course/ Medical Decision Making/ A&P                             Medical Decision Making Amount and/or Complexity of Data Reviewed Labs: ordered. Radiology: ordered.  This patients CHA2DS2-VASc Score and unadjusted Ischemic Stroke Rate (% per year) is equal to 3.2 % stroke rate/year from a score of 3  Above score calculated as 1 point each if present [CHF, HTN, DM, Vascular=MI/PAD/Aortic Plaque, Age if 65-74, or Female] Above score calculated as 2 points each if present [Age > 75, or Stroke/TIA/TE]    Patient here for evaluation of palpitations.  She did use a K[employer] mobile app at home and one of the results was possible A-fib.  This was reviewed and there was significant artifact.  Areas that could be interpreted were clearly sinus rhythm.  Her EKG was uploaded under the media tab from urgent care-this is also sinus rhythm.  She has not sinus rhythm on multiple EKGs, there are also others with PACs as well as artifact.  No definite A-fib at this time.  Troponins are negative x 2.  No significant anemia or electrolyte abnormality.  She did just have outpatient thyroid studies performed-unable to review these results, will not recheck at this time.  Feel she is stable for cardiology follow-up.  Will not start any rate controlling medications as patient has been rate controlled throughout her ED stay.  Current clinical picture is not consistent with PE, ACS.  Discussed return precautions.                  Final Clinical Impression(s) / ED Diagnoses Final diagnoses:  Palpitations    Rx / DC Orders ED Discharge Orders          Ordered    Ambulatory referral to Cardiology        03/27/23 0116              Tilden Fossa, MD 03/27/23 [phone]

## 2023-04-04 NOTE — Progress Notes (Signed)
Cardiology Office Note:    Date:  04/09/2023   ID:  Amy Blackwell, DOB [birth year], MRN [id number]  PCP:  Michaelle Birks [facility]   [facility] Providers Cardiologist:  Rollene Rotunda, MD     Referring MD: Erling Cruz, [facility]   Chief Complaint  Patient presents with   Follow-up    palpitations    History of Present Illness:    Amy Blackwell is a 72 y.o. female with no prior cardiac history establish care with cardiology for management of chest pain.  Coronary calcium score 08/2018 showed a coronary calcium score of 57 placing her at the 73rd percentile.  She has a history of pericardial effusion and some coronary calcification noted on CT. she underwent a POET but unfortunately this was nondiagnostic because of frequent ectopy.  Nuclear stress test 05/2019 was nonischemic.  Echocardiogram around that time revealed preserved LVEF 55-60% no significant valvular disease, grade 1 DD.  Due to palpitations Zio patch was obtained 04/2020 and showed NSR with occasional short runs of SVT with the longest SVT run 12 beats and rare VE.  No A-fib or flutter.  She was recently seen by her PCP and TSH was WNL.  She was seen in the ER 03/26/2023 with chest pain and palpitations.  She checked her rhythm on her husband's Kardia mobile device which resulted in "undetermined rhythm" and "possible A-fib".  Strips reviewed in the ER and there was significant artifact.  Telemetry and EKGs reviewed.  Since there was no definite A-fib, she was not anticoagulated for CHA2DS2-VASc of 3.  Ruled out with negative cardiac enzymes and nonischemic EKG.  She was discharged without admission.  She presents today for ER follow-up.  She reports that she continues to have palpitations weekly, she checks her cardia device daily and usually gets an undetermined rhythm reading.  I reviewed labs, thyroid panel looked good in July.  She is convinced her thyroid medication is not correct.  I will leave this to her  provider.  She is not taking 40 mg Lipitor daily.  She states she is back on keto.  We had a long discussion about controlling her cholesterol and her elevated coronary calcium score.   Past Medical History:  Diagnosis Date   ADD (attention deficit disorder)    ADHD    Anemia    5/17/22not current   Anxiety    situational   Arthritis    Asthma    Back pain    Bursitis    Chest pain    Complication of anesthesia    Constipation    Depression    Fatty liver    Fibromyalgia    Food allergy    Gallbladder problem    History of blood transfusion    age 24   Hypercholesterolemia    Hypothyroidism    Joint pain    Osteoarthritis    Osteoporosis    Other fatigue    Pneumonia    Shortness of breath on exertion    Thoracic spondylosis    Thyroid disease    Vitamin D deficiency     Past Surgical History:  Procedure Laterality Date   25 GAUGE PARS PLANA VITRECTOMY WITH 20 GAUGE MVR PORT FOR MACULAR HOLE Right 01/15/2021   Procedure: 25 GAUGE PARS PLANA VITRECTOMY WITH 20 GAUGE MVR PORT FOR MACULAR HOLE;  Surgeon: Sherrie George, MD;  Location: [facility] OR;  Service: Ophthalmology;  Laterality: Right;   COLONOSCOPY     EYE SURGERY  KNEE SURGERY      Current Medications: Current Meds  Medication Sig   atorvastatin (LIPITOR) 40 MG tablet Take 40 mg by mouth daily.   Bioflavonoid Products (SUPER-C 1000 PO) Take 1,000 mg by mouth daily.   Coenzyme Q10 300 MG CAPS Take 300 mg by mouth daily.   ibuprofen (ADVIL) 200 MG tablet Take 200 mg by mouth every 6 (six) hours as needed (arthritis).   magnesium oxide (MAG-OX) 400 MG tablet Take 400 mg by mouth daily.   Omega-3 Fatty Acids (SUPER OMEGA 3 EPA/DHA PO) Take 1 capsule by mouth daily.   Vitamin D, Ergocalciferol, (DRISDOL) 1.25 MG (50000 UNIT) CAPS capsule Take 1 capsule (50,000 Units total) by mouth every 7 (seven) days.     Allergies:   Codeine, Shellfish allergy, and Tetanus toxoids   Social History   Socioeconomic History    Marital status: Married    Spouse name: Mikhaela Zaugg   Number of children: 0   Years of education: Not on file   Highest education level: Not on file  Occupational History   Occupation: retired  Tobacco Use   Smoking status: Former    Types: Cigarettes   Smokeless tobacco: Never  Vaping Use   Vaping status: Never Used  Substance and Sexual Activity   Alcohol use: No   Drug use: No   Sexual activity: Yes    Partners: Male    Birth control/protection: None  Other Topics Concern   Not on file  Social History Narrative   Not on file   Social Determinants of Health   Financial Resource Strain: Not on file  Food Insecurity: Not on file  Transportation Needs: Not on file  Physical Activity: Not on file  Stress: Not on file  Social Connections: Not on file     Family History: The patient's family history includes CAD (age of onset: 37) in her brother; Dementia in her mother; Heart disease in her father; Hyperlipidemia in her father and mother; Hypertension in her mother; Kidney disease in her father; Peripheral vascular disease in her father; Prostate cancer in her father; Stroke in her father.  ROS:   Please see the history of present illness.     All other systems reviewed and are negative.  EKGs/Labs/Other Studies Reviewed:    The following studies were reviewed today:  Cardiac Studies & Procedures     STRESS TESTS  MYOCARDIAL PERFUSION IMAGING 09/29/2018  Narrative  The left ventricular ejection fraction is normal (55-65%).  Nuclear stress EF: 64%. No wall motion abnormalities.  There was no ST segment deviation noted during stress.  This is a low risk study. Mild apical breast attenuation artifact noted. No ischemia or infarct identified.  Donato Schultz, MD   ECHOCARDIOGRAM  ECHOCARDIOGRAM COMPLETE 09/21/2018  Narrative *[facility]* [address] [place], [place]  [address] [phone]  ------------------------------------------------------------------- Transthoracic Echocardiography  Patient:    Amy Blackwell MR #:       [id number] Study Date: 09/21/2018 Gender:     F Age:        60 Height:     157.5 cm Weight:     93.4 kg BSA:        2.07 m^2 Pt. Status: Room:  Keenan Bachelor, Marcos Eke SONOGRAPHER  Randa Evens, Will PERFORMING   [facility], Outpatient ATTENDING    Chilton Si, MD ORDERING     Chilton Si, MD REFERRING    Chilton Si, MD  cc:  -------------------------------------------------------------------  LV EF: 55% -   60%  ------------------------------------------------------------------- Indications:      (I31.3).  ------------------------------------------------------------------- History:   PMH:  Pericardial effusion. Acquired from the patient and from the patient&'s chart.  Risk factors:  Dyslipidemia.  ------------------------------------------------------------------- Study Conclusions  - Left ventricle: The cavity size was normal. There was mild focal basal hypertrophy of the septum. Systolic function was normal. The estimated ejection fraction was in the range of 55% to 60%. Wall motion was normal; there were no regional wall motion abnormalities. Doppler parameters are consistent with abnormal left ventricular relaxation (grade 1 diastolic dysfunction). Doppler parameters are consistent with indeterminate ventricular filling pressure. - Aortic valve: Transvalvular velocity was within the normal range. There was no stenosis. There was no regurgitation. - Mitral valve: Transvalvular velocity was within the normal range. There was no evidence for stenosis. There was trivial regurgitation. - Left atrium: The atrium was mildly dilated. - Right ventricle: The cavity size was normal. Wall thickness was normal. Systolic function was normal. - Atrial septum: No defect or patent foramen ovale was  identified by color flow Doppler. - Tricuspid valve: There was mild regurgitation. - Pulmonary arteries: Systolic pressure was within the normal range. PA peak pressure: 23 mm Hg (S). - Pericardium, extracardiac: A trivial pericardial effusion was identified.  ------------------------------------------------------------------- Study data:  No prior study was available for comparison.  Study status:  Routine.  Procedure:  The patient reported no pain pre or post test. Transthoracic echocardiography for left ventricular function evaluation and for assessment of pericardial effusion and hemodynamic status. Image quality was adequate.  Study completion: There were no complications.          Transthoracic echocardiography.  M-mode, complete 2D, spectral Doppler, and color Doppler.  Birthdate:  Patient birthdate: 02/07/1951.  Age:  Patient is 72 yr old.  Sex:  Gender: female.    BMI: 37.7 kg/m^2.  Blood pressure:     160/90  Patient status:  Outpatient.  Study date: Study date: 09/21/2018. Study time: 01:24 PM.  Location:  [facility]  -------------------------------------------------------------------  ------------------------------------------------------------------- Left ventricle:  The cavity size was normal. There was mild focal basal hypertrophy of the septum. Systolic function was normal. The estimated ejection fraction was in the range of 55% to 60%. Wall motion was normal; there were no regional wall motion abnormalities. Doppler parameters are consistent with abnormal left ventricular relaxation (grade 1 diastolic dysfunction). Doppler parameters are consistent with indeterminate ventricular filling pressure.  ------------------------------------------------------------------- Aortic valve:  Poorly visualized.  Trileaflet; mildly thickened, mildly calcified leaflets. Mobility was not restricted.  Doppler: Transvalvular velocity was within the normal range. There was  no stenosis. There was no regurgitation.  ------------------------------------------------------------------- Aorta:  Aortic root: The aortic root was normal in size.  ------------------------------------------------------------------- Mitral valve:   Structurally normal valve.   Mobility was not restricted.  Doppler:  Transvalvular velocity was within the normal range. There was no evidence for stenosis. There was trivial regurgitation.    Valve area by pressure half-time: 3.19 cm^2. Indexed valve area by pressure half-time: 1.54 cm^2/m^2.    Peak gradient (D): 3 mm Hg.  ------------------------------------------------------------------- Left atrium:  The atrium was mildly dilated.  ------------------------------------------------------------------- Atrial septum:  No defect or patent foramen ovale was identified by color flow Doppler.  ------------------------------------------------------------------- Right ventricle:  The cavity size was normal. Wall thickness was normal. Systolic function was normal.  ------------------------------------------------------------------- Pulmonic valve:    Structurally normal valve.   Cusp separation was normal.  Doppler:  Transvalvular velocity was  within the normal range. There was no evidence for stenosis. There was no regurgitation.  ------------------------------------------------------------------- Tricuspid valve:   Structurally normal valve.    Doppler: Transvalvular velocity was within the normal range. There was mild regurgitation.  ------------------------------------------------------------------- Pulmonary artery:   The main pulmonary artery was normal-sized. Systolic pressure was within the normal range.  ------------------------------------------------------------------- Right atrium:  The atrium was normal in size.  ------------------------------------------------------------------- Pericardium:  A trivial pericardial  effusion was identified.  ------------------------------------------------------------------- Systemic veins: Inferior vena cava: The vessel was normal in size. The respirophasic diameter changes were in the normal range (>= 50%), consistent with normal central venous pressure.  ------------------------------------------------------------------- Measurements  Left ventricle                         Value          Reference LV ID, ED, PLAX chordal                49    mm       43 - 52 LV ID, ES, PLAX chordal                35    mm       23 - 38 LV fx shortening, PLAX chordal         29    %        >=29 LV PW thickness, ED                    10    mm       ---------- IVS/LV PW ratio, ED                    1.1            <=1.3 Stroke volume, 2D                      68    ml       ---------- Stroke volume/bsa, 2D                  33    ml/m^2   ---------- LV e&', lateral                         6.31  cm/s     ---------- LV E/e&', lateral                       13.25          ---------- LV e&', medial                          5.98  cm/s     ---------- LV E/e&', medial                        13.98          ---------- LV e&', average                         6.15  cm/s     ---------- LV E/e&', average                       13.6           ----------  Ventricular septum  Value          Reference IVS thickness, ED                      11    mm       ----------  LVOT                                   Value          Reference LVOT ID, S                             18    mm       ---------- LVOT area                              2.54  cm^2     ---------- LVOT ID                                18    mm       ---------- LVOT peak velocity, S                  112   cm/s     ---------- LVOT mean velocity, S                  70.9  cm/s     ---------- LVOT VTI, S                            26.9  cm       ---------- LVOT peak gradient, S                  5     mm Hg     ---------- Stroke volume (SV), LVOT DP            68.5  ml       ---------- Stroke index (SV/bsa), LVOT DP         33.1  ml/m^2   ----------  Aorta                                  Value          Reference Aortic root ID, ED                     34    mm       ---------- Ascending aorta ID, A-P, S             32    mm       ----------  Left atrium                            Value          Reference LA ID, A-P, ES                         38    mm       ---------- LA ID/bsa, A-P  1.84  cm/m^2   <=2.2 LA volume, S                           49.1  ml       ---------- LA volume/bsa, S                       23.7  ml/m^2   ---------- LA volume, ES, 1-p A4C                 56.3  ml       ---------- LA volume/bsa, ES, 1-p A4C             27.2  ml/m^2   ---------- LA volume, ES, 1-p A2C                 42.3  ml       ---------- LA volume/bsa, ES, 1-p A2C             20.4  ml/m^2   ----------  Mitral valve                           Value          Reference Mitral E-wave peak velocity            83.6  cm/s     ---------- Mitral A-wave peak velocity            98.5  cm/s     ---------- Mitral deceleration time       (H)     235   ms       150 - 230 Mitral pressure half-time              69    ms       ---------- Mitral peak gradient, D                3     mm Hg    ---------- Mitral E/A ratio, peak                 0.8            ---------- Mitral valve area, PHT, DP             3.19  cm^2     ---------- Mitral valve area/bsa, PHT, DP         1.54  cm^2/m^2 ----------  Pulmonary arteries                     Value          Reference PA pressure, S, DP                     23    mm Hg    <=30  Tricuspid valve                        Value          Reference Tricuspid regurg peak velocity         225   cm/s     ---------- Tricuspid peak RV-RA gradient          20    mm Hg    ----------  Right atrium  Value          Reference RA ID, S-I, ES, A4C                     43.8  mm       34 - 49 RA area, ES, A4C                       14.9  cm^2     8.3 - 19.5 RA volume, ES, A/L                     41.1  ml       ---------- RA volume/bsa, ES, A/L                 19.9  ml/m^2   ----------  Systemic veins                         Value          Reference Estimated CVP                          3     mm Hg    ----------  Right ventricle                        Value          Reference TAPSE                                  25    mm       ---------- RV pressure, S, DP                     23    mm Hg    <=30 RV s&', lateral, S                      13.5  cm/s     ----------  Legend: (L)  and  (H)  mark values outside specified reference range.  ------------------------------------------------------------------- Prepared and Electronically Authenticated by  Chilton Si, MD 2020-01-21T16:13:07    MONITORS  LONG TERM MONITOR-LIVE TELEMETRY (3-14 DAYS) 04/12/2020  Narrative Normal sinus rhythm Occasional short runs of SVT Longest SVT run was 12 beats. Rare ventricular ectopy. No sustained arrhythmias   CT SCANS  CT CARDIAC SCORING (SELF PAY ONLY) 08/18/2018  Addendum 08/20/2018  5:03 PM ADDENDUM REPORT: 08/20/2018 17:01  CLINICAL DATA:  Risk stratification  EXAM: Coronary Calcium Score  TECHNIQUE: The patient was scanned on a [employer]. Axial non-contrast 3 mm slices were carried out through the heart. The data set was analyzed on a dedicated work station and scored using the Agatson method.  FINDINGS: Non-cardiac: See separate report from [facility].  Ascending Aorta: Mild diffuse calcifications.  Pericardium: Normal.  Coronary arteries: Normal origin.  IMPRESSION: Coronary calcium score of 57. This was 56 percentile for age and sex matched control.   Electronically Signed By: Tobias Alexander On: 08/20/2018 17:01  Narrative EXAM: OVER-READ INTERPRETATION  CT CHEST  The following report is  an over-read performed by radiologist Dr. Charlett Nose of [facility] on 08/18/2018. This over-read does not include interpretation of cardiac or coronary anatomy or pathology. The coronary calcium score. Interpretation by the  cardiologist is attached.  COMPARISON:  None.  FINDINGS: Vascular: Small pericardial effusion versus pericardial thickening. Heart is normal size. Scattered aortic calcifications. No evidence of aortic aneurysm.  Mediastinum/Nodes: No adenopathy in the lower mediastinum or hila.  Lungs/Pleura: Visualized lungs clear.  No effusions.  Upper Abdomen: Imaging into the upper abdomen shows no acute findings.  Musculoskeletal: Chest wall soft tissues are unremarkable. No acute bony abnormality.  IMPRESSION: Small pericardial effusion versus pericardial thickening.  Aortic atherosclerosis.  Electronically Signed: By: Charlett Nose M.D. On: 08/18/2018 11:31           EKG Interpretation Date/Time:  Thursday April 09 2023 09:03:39 EDT Ventricular Rate:  54 PR Interval:  160 QRS Duration:  144 QT Interval:  458 QTC Calculation: 434 R Axis:   -13  Text Interpretation: Sinus bradycardia Right bundle branch block When compared with ECG of 26-Mar-2023 23:38, PREVIOUS ECG IS PRESENT Confirmed by Micah Flesher ([id number]) on 04/09/2023 9:19:41 AM    Recent Labs: 03/26/2023: BUN 21; Creatinine, Ser 0.92; Hemoglobin 12.7; Magnesium 2.1; Platelets 268; Potassium 4.1; Sodium 142  Recent Lipid Panel    Component Value Date/Time   CHOL 227 (H) 09/20/2020 0958   TRIG 100 09/20/2020 0958   HDL 71 09/20/2020 0958   CHOLHDL 4.7 (H) 03/13/2020 1003   LDLCALC 139 (H) 09/20/2020 0958     Risk Assessment/Calculations:      HYPERTENSION CONTROL Vitals:   04/09/23 0905 04/09/23 0942  BP: (!) 158/78 (!) 150/78    The patient's blood pressure is elevated above target today.  In order to address the patient's elevated BP: Blood pressure will be monitored at  home to determine if medication changes need to be made.            Physical Exam:    VS:  BP (!) 150/78   Pulse (!) 54   Ht 5\' 1"  (1.549 m)   Wt 215 lb (97.5 kg)   SpO2 96%   BMI 40.62 kg/m     Wt Readings from Last 3 Encounters:  04/09/23 215 lb (97.5 kg)  03/26/23 210 lb (95.3 kg)  01/15/21 210 lb (95.3 kg)     GEN:  Well nourished, well developed in no acute distress HEENT: Normal NECK: No JVD; No carotid bruits LYMPHATICS: No lymphadenopathy CARDIAC: RRR, no murmurs, rubs, gallops RESPIRATORY:  Clear to auscultation without rales, wheezing or rhonchi  ABDOMEN: Soft, non-tender, non-distended MUSCULOSKELETAL:  No edema; No deformity  SKIN: Warm and dry NEUROLOGIC:  Alert and oriented x 3 PSYCHIATRIC:  Normal affect   ASSESSMENT:    1. Primary hypertension   2. Palpitations   3. Aortic atherosclerosis (HCC)   4. Coronary artery calcification   5. Hyperlipidemia with target LDL less than 70   6. Hypothyroidism, unspecified type    PLAN:    In order of problems listed above:   Palpitations Will place a 14-day Zio patch Mg 2.1 in the ER -She does state that she is agreeable to [facility] should she have A-fib   Hypertension - she was hypertensive today - she does not like taking medications - she will monitor at home and we will address at next visit.    Hyperlipidemia Recent lipid panel 03/20/2023: LDL 109, HDL 62, trig: 67 Has been taking 40 mg lipitor and omega-3 She states she is not taking lipitor regularly because she doesn't like pills - she also states she is back on keto - recommend taking 40 mg Lipitor regularly -Plan to  check her lipid panel when she returns in 6 weeks to discuss her heart monitor   Hypothyroidism Thyroid panel WNL at recent PCP visit Was taking 90 mcg, recently switched to 75 mcg - she thinks her thyroid medication is causing her palpitations. She feels like she is taking "too much thyroid medication" -Defer to  provider   Coronary calcifications Elevated coronary calcium score-continue with risk factor modification   Follow-up in 6 weeks to discuss heart monitor and for repeat lipid panel           Medication Adjustments/Labs and Tests Ordered: Current medicines are reviewed at length with the patient today.  Concerns regarding medicines are outlined above.  Orders Placed This Encounter  Procedures   LONG TERM MONITOR (3-14 DAYS)   EKG 12-Lead   No orders of the defined types were placed in this encounter.   Patient Instructions  Medication Instructions:  No Changes *If you need a refill on your cardiac medications before your next appointment, please call your pharmacy*   Lab Work: No Labs If you have labs (blood work) drawn today and your tests are completely normal, you will receive your results only by: MyChart Message (if you have MyChart) OR A paper copy in the mail If you have any lab test that is abnormal or we need to change your treatment, we will call you to review the results.   Testing/Procedures: Christena Deem- Long Term Monitor Instructions  Your physician has requested you wear a ZIO patch monitor for 14 days.  This is a single patch monitor. [employer] supplies one patch monitor per enrollment. Additional stickers are not available. Please do not apply patch if you will be having a Nuclear Stress Test,  Echocardiogram, Cardiac CT, MRI, or Chest Xray during the period you would be wearing the  monitor. The patch cannot be worn during these tests. You cannot remove and re-apply the  ZIO XT patch monitor.  Your ZIO patch monitor will be mailed 3 day USPS to your address on file. It may take 3-5 days  to receive your monitor after you have been enrolled.  Once you have received your monitor, please review the enclosed instructions. Your monitor  has already been registered assigning a specific monitor serial # to you.  Billing and Patient Assistance Program  Information  We have supplied [employer] with any of your insurance information on file for billing purposes. [employer] offers a sliding scale Patient Assistance Program for patients that do not have  insurance, or whose insurance does not completely cover the cost of the ZIO monitor.  You must apply for the Patient Assistance Program to qualify for this discounted rate.  To apply, please call [employer] at [phone], select option 4, select option 2, ask to apply for  Patient Assistance Program. Meredeth Ide will ask your household income, and how many people  are in your household. They will quote your out-of-pocket cost based on that information.  [employer] will also be able to set up a 72-month, interest-free payment plan if needed.  Applying the monitor   Shave hair from upper left chest.  Hold abrader disc by orange tab. Rub abrader in 40 strokes over the upper left chest as  indicated in your monitor instructions.  Clean area with 4 enclosed alcohol pads. Let dry.  Apply patch as indicated in monitor instructions. Patch will be placed under collarbone on left  side of chest with arrow pointing upward.  Rub patch adhesive wings for 2 minutes. Remove  white label marked "1". Remove the white  label marked "2". Rub patch adhesive wings for 2 additional minutes.  While looking in a mirror, press and release button in center of patch. A small green light will  flash 3-4 times. This will be your only indicator that the monitor has been turned on.  Do not shower for the first 24 hours. You may shower after the first 24 hours.  Press the button if you feel a symptom. You will hear a small click. Record Date, Time and  Symptom in the Patient Logbook.  When you are ready to remove the patch, follow instructions on the last 2 pages of Patient  Logbook. Stick patch monitor onto the last page of Patient Logbook.  Place Patient Logbook in the blue and white box. Use locking tab on box and tape box closed   securely. The blue and white box has prepaid postage on it. Please place it in the mailbox as  soon as possible. Your physician should have your test results approximately 7 days after the  monitor has been mailed back to [facility].  Call [facility] Customer Care at [phone] if you have questions regarding  your ZIO XT patch monitor. Call them immediately if you see an orange light blinking on your  monitor.  If your monitor falls off in less than 4 days, contact our Monitor department at [phone].  If your monitor becomes loose or falls off after 4 days call [employer] at [phone] for  suggestions on securing your monitor    Follow-Up: At [facility], you and your health needs are our priority.  As part of our continuing mission to provide you with exceptional heart care, we have created designated Provider Care Teams.  These Care Teams include your primary Cardiologist (physician) and Advanced Practice Providers (APPs -  Physician Assistants and Nurse Practitioners) who all work together to provide you with the care you need, when you need it.  We recommend signing up for the patient portal called "MyChart".  Sign up information is provided on this After Visit Summary.  MyChart is used to connect with patients for Virtual Visits (Telemedicine).  Patients are able to view lab/test results, encounter notes, upcoming appointments, etc.  Non-urgent messages can be sent to your provider as well.   To learn more about what you can do with MyChart, go to [email or web address].    Your next appointment:   6 week(s)  Provider:   Micah Flesher, PA-C      Signed, Marcelino Duster, [place]  04/09/2023 9:44 AM    [facility]

## 2023-04-09 ENCOUNTER — Encounter: Payer: Self-pay | Admitting: Physician Assistant

## 2023-04-09 ENCOUNTER — Ambulatory Visit (INDEPENDENT_AMBULATORY_CARE_PROVIDER_SITE_OTHER): Payer: PPO

## 2023-04-09 ENCOUNTER — Ambulatory Visit: Payer: PPO | Attending: Physician Assistant | Admitting: Physician Assistant

## 2023-04-09 VITALS — BP 150/78 | HR 54 | Ht 61.0 in | Wt 215.0 lb

## 2023-04-09 DIAGNOSIS — E039 Hypothyroidism, unspecified: Secondary | ICD-10-CM

## 2023-04-09 DIAGNOSIS — I700 Atherosclerosis of aorta: Secondary | ICD-10-CM

## 2023-04-09 DIAGNOSIS — I2584 Coronary atherosclerosis due to calcified coronary lesion: Secondary | ICD-10-CM

## 2023-04-09 DIAGNOSIS — E785 Hyperlipidemia, unspecified: Secondary | ICD-10-CM

## 2023-04-09 DIAGNOSIS — R002 Palpitations: Secondary | ICD-10-CM

## 2023-04-09 DIAGNOSIS — I10 Essential (primary) hypertension: Secondary | ICD-10-CM | POA: Diagnosis not present

## 2023-04-09 DIAGNOSIS — I2510 Atherosclerotic heart disease of native coronary artery without angina pectoris: Secondary | ICD-10-CM | POA: Diagnosis not present

## 2023-04-09 NOTE — Patient Instructions (Signed)
Medication Instructions:  No Changes *If you need a refill on your cardiac medications before your next appointment, please call your pharmacy*   Lab Work: No Labs If you have labs (blood work) drawn today and your tests are completely normal, you will receive your results only by: MyChart Message (if you have MyChart) OR A paper copy in the mail If you have any lab test that is abnormal or we need to change your treatment, we will call you to review the results.   Testing/Procedures: Christena Deem- Long Term Monitor Instructions  Your physician has requested you wear a ZIO patch monitor for 14 days.  This is a single patch monitor. [employer] supplies one patch monitor per enrollment. Additional stickers are not available. Please do not apply patch if you will be having a Nuclear Stress Test,  Echocardiogram, Cardiac CT, MRI, or Chest Xray during the period you would be wearing the  monitor. The patch cannot be worn during these tests. You cannot remove and re-apply the  ZIO XT patch monitor.  Your ZIO patch monitor will be mailed 3 day USPS to your address on file. It may take 3-5 days  to receive your monitor after you have been enrolled.  Once you have received your monitor, please review the enclosed instructions. Your monitor  has already been registered assigning a specific monitor serial # to you.  Billing and Patient Assistance Program Information  We have supplied [employer] with any of your insurance information on file for billing purposes. [employer] offers a sliding scale Patient Assistance Program for patients that do not have  insurance, or whose insurance does not completely cover the cost of the ZIO monitor.  You must apply for the Patient Assistance Program to qualify for this discounted rate.  To apply, please call [employer] at [phone], select option 4, select option 2, ask to apply for  Patient Assistance Program. Meredeth Ide will ask your household income, and how many people   are in your household. They will quote your out-of-pocket cost based on that information.  [employer] will also be able to set up a 19-month, interest-free payment plan if needed.  Applying the monitor   Shave hair from upper left chest.  Hold abrader disc by orange tab. Rub abrader in 40 strokes over the upper left chest as  indicated in your monitor instructions.  Clean area with 4 enclosed alcohol pads. Let dry.  Apply patch as indicated in monitor instructions. Patch will be placed under collarbone on left  side of chest with arrow pointing upward.  Rub patch adhesive wings for 2 minutes. Remove white label marked "1". Remove the white  label marked "2". Rub patch adhesive wings for 2 additional minutes.  While looking in a mirror, press and release button in center of patch. A small green light will  flash 3-4 times. This will be your only indicator that the monitor has been turned on.  Do not shower for the first 24 hours. You may shower after the first 24 hours.  Press the button if you feel a symptom. You will hear a small click. Record Date, Time and  Symptom in the Patient Logbook.  When you are ready to remove the patch, follow instructions on the last 2 pages of Patient  Logbook. Stick patch monitor onto the last page of Patient Logbook.  Place Patient Logbook in the blue and white box. Use locking tab on box and tape box closed  securely. The blue and white box  has prepaid postage on it. Please place it in the mailbox as  soon as possible. Your physician should have your test results approximately 7 days after the  monitor has been mailed back to [facility].  Call [facility] Customer Care at [phone] if you have questions regarding  your ZIO XT patch monitor. Call them immediately if you see an orange light blinking on your  monitor.  If your monitor falls off in less than 4 days, contact our Monitor department at [phone].  If your monitor becomes loose or  falls off after 4 days call [employer] at [phone] for  suggestions on securing your monitor    Follow-Up: At [facility], you and your health needs are our priority.  As part of our continuing mission to provide you with exceptional heart care, we have created designated Provider Care Teams.  These Care Teams include your primary Cardiologist (physician) and Advanced Practice Providers (APPs -  Physician Assistants and Nurse Practitioners) who all work together to provide you with the care you need, when you need it.  We recommend signing up for the patient portal called "MyChart".  Sign up information is provided on this After Visit Summary.  MyChart is used to connect with patients for Virtual Visits (Telemedicine).  Patients are able to view lab/test results, encounter notes, upcoming appointments, etc.  Non-urgent messages can be sent to your provider as well.   To learn more about what you can do with MyChart, go to [email or web address].    Your next appointment:   6 week(s)  Provider:   Micah Flesher, PA-C

## 2023-04-09 NOTE — Progress Notes (Unsigned)
Enrolled patient for a 14 day Zio XT monitor to be mailed to patients home  [place] to read

## 2023-04-13 DIAGNOSIS — R002 Palpitations: Secondary | ICD-10-CM

## 2023-04-13 DIAGNOSIS — I10 Essential (primary) hypertension: Secondary | ICD-10-CM

## 2023-05-12 ENCOUNTER — Telehealth: Payer: Self-pay | Admitting: Cardiology

## 2023-05-12 NOTE — Telephone Encounter (Signed)
Patient is calling in about her results. Please advise

## 2023-05-12 NOTE — Telephone Encounter (Signed)
LVM to call office.

## 2023-05-13 NOTE — Telephone Encounter (Signed)
Call to patient and she states she has seen them on My Chart.  She confirms her F/U appt while on phone

## 2023-05-21 ENCOUNTER — Ambulatory Visit: Payer: PPO | Admitting: Physician Assistant

## 2023-06-03 ENCOUNTER — Ambulatory Visit: Payer: PPO | Attending: Physician Assistant | Admitting: Student

## 2023-06-03 ENCOUNTER — Encounter: Payer: Self-pay | Admitting: Student

## 2023-06-03 VITALS — BP 124/72 | HR 53 | Ht 61.0 in | Wt 220.0 lb

## 2023-06-03 DIAGNOSIS — R002 Palpitations: Secondary | ICD-10-CM

## 2023-06-03 DIAGNOSIS — E785 Hyperlipidemia, unspecified: Secondary | ICD-10-CM | POA: Diagnosis not present

## 2023-06-03 DIAGNOSIS — I2510 Atherosclerotic heart disease of native coronary artery without angina pectoris: Secondary | ICD-10-CM | POA: Diagnosis not present

## 2023-06-03 MED ORDER — ASPIRIN 81 MG PO TBEC: 81.0000 mg | DELAYED_RELEASE_TABLET | Freq: Every day | ORAL | Status: AC

## 2023-06-03 NOTE — Patient Instructions (Signed)
Medication Instructions:  Your physician has recommended you make the following change in your medication:   -Start taking aspirin EC 81mg  once daily  *If you need a refill on your cardiac medications before your next appointment, please call your pharmacy*    Follow-Up: At [facility], you and your health needs are our priority.  As part of our continuing mission to provide you with exceptional heart care, we have created designated Provider Care Teams.  These Care Teams include your primary Cardiologist (physician) and Advanced Practice Providers (APPs -  Physician Assistants and Nurse Practitioners) who all work together to provide you with the care you need, when you need it.  We recommend signing up for the patient portal called "MyChart".  Sign up information is provided on this After Visit Summary.  MyChart is used to connect with patients for Virtual Visits (Telemedicine).  Patients are able to view lab/test results, encounter notes, upcoming appointments, etc.  Non-urgent messages can be sent to your provider as well.   To learn more about what you can do with MyChart, go to [email or web address].    Your next appointment:   12 month(s)  Provider:   Rollene Rotunda, MD  or Marjie Skiff, PA-C

## 2024-05-05 ENCOUNTER — Encounter: Payer: Self-pay | Admitting: Cardiology
